# Patient Record
Sex: Male | Born: 1950 | Race: White | Hispanic: No | Marital: Single | Smoking: Former smoker
Health system: Southern US, Community
[De-identification: ages and names within clinical notes are randomized; demographics above are authoritative.]

## PROBLEM LIST (undated history)

## (undated) DIAGNOSIS — M1990 Unspecified osteoarthritis, unspecified site: Secondary | ICD-10-CM

## (undated) DIAGNOSIS — R002 Palpitations: Secondary | ICD-10-CM

## (undated) DIAGNOSIS — I639 Cerebral infarction, unspecified: Secondary | ICD-10-CM

## (undated) DIAGNOSIS — C801 Malignant (primary) neoplasm, unspecified: Secondary | ICD-10-CM

## (undated) DIAGNOSIS — S42201A Unspecified fracture of upper end of right humerus, initial encounter for closed fracture: Secondary | ICD-10-CM

## (undated) DIAGNOSIS — F32A Depression, unspecified: Secondary | ICD-10-CM

## (undated) DIAGNOSIS — H33311 Horseshoe tear of retina without detachment, right eye: Secondary | ICD-10-CM

## (undated) DIAGNOSIS — I252 Old myocardial infarction: Secondary | ICD-10-CM

## (undated) DIAGNOSIS — K219 Gastro-esophageal reflux disease without esophagitis: Secondary | ICD-10-CM

## (undated) DIAGNOSIS — I4891 Unspecified atrial fibrillation: Secondary | ICD-10-CM

## (undated) DIAGNOSIS — G4730 Sleep apnea, unspecified: Secondary | ICD-10-CM

## (undated) DIAGNOSIS — F329 Major depressive disorder, single episode, unspecified: Secondary | ICD-10-CM

## (undated) DIAGNOSIS — I10 Essential (primary) hypertension: Secondary | ICD-10-CM

## (undated) HISTORY — DX: Depression, unspecified: F32.A

## (undated) HISTORY — PX: TONSILLECTOMY: SUR1361

## (undated) HISTORY — PX: CARPAL TUNNEL RELEASE: SHX101

## (undated) HISTORY — PX: CLAVICLE SURGERY: SHX598

## (undated) HISTORY — DX: Palpitations: R00.2

## (undated) HISTORY — DX: Old myocardial infarction: I25.2

## (undated) HISTORY — DX: Major depressive disorder, single episode, unspecified: F32.9

## (undated) HISTORY — DX: Cerebral infarction, unspecified: I63.9

## (undated) HISTORY — PX: COLONOSCOPY: SHX174

## (undated) HISTORY — PX: STOMACH SURGERY: SHX791

---

## 2003-02-10 DIAGNOSIS — I639 Cerebral infarction, unspecified: Secondary | ICD-10-CM

## 2003-02-10 HISTORY — DX: Cerebral infarction, unspecified: I63.9

## 2008-02-10 DIAGNOSIS — I252 Old myocardial infarction: Secondary | ICD-10-CM

## 2008-02-10 HISTORY — DX: Old myocardial infarction: I25.2

## 2008-02-10 HISTORY — PX: CARDIAC CATHETERIZATION: SHX172

## 2009-10-03 ENCOUNTER — Ambulatory Visit: Payer: Self-pay | Admitting: Diagnostic Radiology

## 2009-10-03 ENCOUNTER — Ambulatory Visit (HOSPITAL_BASED_OUTPATIENT_CLINIC_OR_DEPARTMENT_OTHER): Admission: RE | Admit: 2009-10-03 | Discharge: 2009-10-03 | Payer: Self-pay | Admitting: Internal Medicine

## 2010-02-14 ENCOUNTER — Encounter
Admission: RE | Admit: 2010-02-14 | Discharge: 2010-02-14 | Payer: Self-pay | Source: Home / Self Care | Attending: Internal Medicine | Admitting: Internal Medicine

## 2010-03-19 ENCOUNTER — Other Ambulatory Visit: Payer: Self-pay | Admitting: Psychiatry

## 2010-03-19 DIAGNOSIS — M542 Cervicalgia: Secondary | ICD-10-CM

## 2010-03-23 ENCOUNTER — Ambulatory Visit
Admission: RE | Admit: 2010-03-23 | Discharge: 2010-03-23 | Disposition: A | Discharge status: Home / Self Care | Payer: Medicare PPO | Source: Ambulatory Visit | Attending: Psychiatry | Admitting: Psychiatry

## 2010-03-23 DIAGNOSIS — M542 Cervicalgia: Secondary | ICD-10-CM

## 2010-03-23 MED ORDER — GADOBENATE DIMEGLUMINE 529 MG/ML IV SOLN
20.0000 mL | Freq: Once | INTRAVENOUS | Status: AC | PRN
  Administered 2010-03-23: 20 mL via INTRAVENOUS

## 2011-04-25 ENCOUNTER — Ambulatory Visit (INDEPENDENT_AMBULATORY_CARE_PROVIDER_SITE_OTHER): Payer: Medicare PPO | Admitting: Family Medicine

## 2011-04-25 VITALS — BP 107/70 | HR 114 | Temp 97.9°F | Resp 18 | Ht 68.0 in | Wt 243.0 lb

## 2011-04-25 DIAGNOSIS — T148XXA Other injury of unspecified body region, initial encounter: Secondary | ICD-10-CM

## 2011-04-25 DIAGNOSIS — W11XXXA Fall on and from ladder, initial encounter: Secondary | ICD-10-CM

## 2011-04-25 MED ORDER — DOXYCYCLINE HYCLATE 100 MG PO TABS: 100.0000 mg | ORAL_TABLET | Freq: Two times a day (BID) | ORAL | Status: AC

## 2011-04-25 NOTE — Progress Notes (Signed)
  Subjective:    Patient ID: Wesley Beard, male    DOB: [birth year], 61 y.o.   MRN: [id number]  HPI Patient presents after falling off ladder  6 days ago. Patient fell on left side.  Patient's (R) leg suffered a laceration as he was falling. More redness and swelling over the last several days in area around anterior tibia.  Also bruise (L) elbow  LBP and left hip pain   Review of Systems     Objective:   Physical Exam  Constitutional: He appears well-developed and well-nourished.  Neck: Neck supple.  Cardiovascular: Normal rate, regular rhythm and normal heart sounds.   Pulmonary/Chest: Effort normal and breath sounds normal.  Abdominal: Soft. Bowel sounds are normal.  Musculoskeletal: Normal range of motion.  Neurological: He is alert.  Skin:     Psychiatric: He has a normal mood and affect.        Assessment & Plan:   1. Fall from ladder    2. Hematoma, infected (R) LE doxycycline (VIBRA-TABS) 100 MG tablet   Anticipatory guidance Patient to follow up in 48 hours, sooner if symptoms worsen

## 2011-04-28 ENCOUNTER — Ambulatory Visit: Payer: Medicare PPO

## 2011-04-28 ENCOUNTER — Ambulatory Visit (INDEPENDENT_AMBULATORY_CARE_PROVIDER_SITE_OTHER): Payer: Medicare PPO | Admitting: Family Medicine

## 2011-04-28 VITALS — BP 116/79 | HR 82 | Temp 98.4°F | Resp 16 | Ht 68.0 in | Wt 244.0 lb

## 2011-04-28 DIAGNOSIS — M7989 Other specified soft tissue disorders: Secondary | ICD-10-CM

## 2011-04-28 DIAGNOSIS — M79609 Pain in unspecified limb: Secondary | ICD-10-CM

## 2011-04-28 DIAGNOSIS — S8990XA Unspecified injury of unspecified lower leg, initial encounter: Secondary | ICD-10-CM

## 2011-04-28 DIAGNOSIS — S8991XA Unspecified injury of right lower leg, initial encounter: Secondary | ICD-10-CM

## 2011-04-28 DIAGNOSIS — M79604 Pain in right leg: Secondary | ICD-10-CM

## 2011-04-28 NOTE — Progress Notes (Signed)
[facility]:  Office Visit  Chief Complaint:  Chief Complaint  Patient presents with  . Leg Pain    not better from 3/16  . Leg Swelling    HPI: Wesley Beard is a 61 y.o. male who complains of  Right leg pain s/p falling off ladder about 9 days ago. Was evaluated and dx with hematoma and laceration. He cont to have pain and swelling. No xrays taken. H/o TIA. Denies SOB  Past Medical History  Diagnosis Date  . MI, old 2010  . Heart palpitations     Dr. Judithe Modest  . Stroke   . Depression    Past Surgical History  Procedure Date  . Cardiac catheterization     normal  . Clavicle surgery    History   Social History  . Marital Status: Single    Spouse Name: N/A    Number of Children: N/A  . Years of Education: N/A   Social History Main Topics  . Smoking status: Former Smoker    Quit date: 09/29/2006  . Smokeless tobacco: None  . Alcohol Use: None  . Drug Use: None  . Sexually Active: None   Other Topics Concern  . None   Social History Narrative  . None   Family History  Problem Relation Age of Onset  . Heart disease Mother   . Heart disease Father    No Known Allergies Prior to Admission medications   Medication Sig Start Date End Date Taking? Authorizing Provider  ARIPiprazole (ABILIFY) 5 MG tablet Take 5 mg by mouth daily.    Historical Provider, MD  atorvastatin (LIPITOR) 80 MG tablet Take 80 mg by mouth daily.    Historical Provider, MD  Cinnamon 500 MG TABS Take 500 mg by mouth 2 (two) times daily.    Historical Provider, MD  doxycycline (VIBRA-TABS) 100 MG tablet Take 1 tablet (100 mg total) by mouth 2 (two) times daily. 04/25/11 05/05/11  Dois Davenport, MD  losartan (COZAAR) 50 MG tablet Take 50 mg by mouth daily.    Historical Provider, MD  mometasone (NASONEX) 50 MCG/ACT nasal spray Place 2 sprays into the nose daily.    Historical Provider, MD     ROS: The patient denies fevers, chills, night sweats, unintentional weight  loss, chest pain, palpitations, wheezing, dyspnea on exertion, nausea, vomiting, abdominal pain, dysuria, hematuria, melena, numbness, weakness, or tingling. + leg pain, swelling  All other systems have been reviewed and were otherwise negative with the exception of those mentioned in the HPI and as above.    PHYSICAL EXAM: Filed Vitals:   04/28/11 1048  BP: 116/79  Pulse: 82  Temp: 98.4 F (36.9 C)  Resp: 16   Filed Vitals:   04/28/11 1048  Height: 5\' 8"  (1.727 m)  Weight: 244 lb (110.678 kg)   Body mass index is 37.10 kg/(m^2).  General: Alert, no acute distress HEENT:  Normocephalic, atraumatic, oropharynx patent.  Cardiovascular:  Regular rate and rhythm, no rubs murmurs or gallops.  No Carotid bruits, radial pulse intact. No pedal edema.  Respiratory: Clear to auscultation bilaterally.  No wheezes, rales, or rhonchi.  No cyanosis, no use of accessory musculature GI: No organomegaly, abdomen is soft and non-tender, positive bowel sounds.  No masses. Skin: + laceration and hematoma on Right leg. Swelling on right leg > left. + erythema. + scab Neurologic: Facial musculature symmetric. Psychiatric: Patient is appropriate throughout our interaction. Lymphatic: No cervical lymphadenopathy Musculoskeletal: Gait intact. Left  calf 11.5 inches Right calf 14 inches   LABS: No results found for this or any previous visit.   EKG/XRAY:   Primary read interpreted by Dr. Conley Rolls at [facility]. No dislocation. No fx at site of hematoma however there is ? Old boney vs acute  abnormality above tibial tuberosity.    ASSESSMENT/PLAN: Encounter Diagnoses  Name Primary?  . Right leg injury Yes  . Right leg pain   . Leg swelling    C/w RICE C/w Doxycycline Xray negative for fx/dislocation Will get Doppler study r/o DVT, patient has hx of several TIAs    Rahil Passey PHUONG, DO 04/28/2011 11:57 AM

## 2011-05-01 ENCOUNTER — Telehealth: Payer: Self-pay

## 2011-05-01 ENCOUNTER — Ambulatory Visit (HOSPITAL_COMMUNITY)
Admission: RE | Admit: 2011-05-01 | Discharge: 2011-05-01 | Disposition: A | Discharge status: Home / Self Care | Payer: Medicare PPO | Source: Ambulatory Visit | Attending: Family Medicine | Admitting: Family Medicine

## 2011-05-01 DIAGNOSIS — M7989 Other specified soft tissue disorders: Secondary | ICD-10-CM | POA: Insufficient documentation

## 2011-05-01 DIAGNOSIS — M79604 Pain in right leg: Secondary | ICD-10-CM

## 2011-05-01 NOTE — Telephone Encounter (Signed)
 calling to get in contact with Dr Conley Rolls or nurse -pt was seen in office today and his lab report came back negative he does have a hematoma if any questions please contact [phone]

## 2011-05-01 NOTE — Telephone Encounter (Signed)
Left message regarding negative DVT, + hematoma on doppler. C/w RICE

## 2011-05-01 NOTE — Progress Notes (Signed)
*  PRELIMINARY RESULTS* Vascular Ultrasound Right lower extremity venous duplex has been completed.  Preliminary findings: Right= No evidence of deep or superficial thrombus. Hematoma seen in anterior mid calf at area of injury.  Farrel Demark RDMS 05/01/2011, 10:57 AM

## 2012-02-14 ENCOUNTER — Ambulatory Visit (INDEPENDENT_AMBULATORY_CARE_PROVIDER_SITE_OTHER): Payer: Medicare PPO | Admitting: Family Medicine

## 2012-02-14 ENCOUNTER — Ambulatory Visit: Payer: Medicare PPO

## 2012-02-14 VITALS — BP 111/74 | HR 76 | Temp 98.3°F | Resp 16 | Ht 68.0 in | Wt 224.0 lb

## 2012-02-14 DIAGNOSIS — R05 Cough: Secondary | ICD-10-CM

## 2012-02-14 DIAGNOSIS — R51 Headache: Secondary | ICD-10-CM

## 2012-02-14 DIAGNOSIS — R059 Cough, unspecified: Secondary | ICD-10-CM

## 2012-02-14 DIAGNOSIS — IMO0001 Reserved for inherently not codable concepts without codable children: Secondary | ICD-10-CM

## 2012-02-14 DIAGNOSIS — J111 Influenza due to unidentified influenza virus with other respiratory manifestations: Secondary | ICD-10-CM

## 2012-02-14 DIAGNOSIS — M791 Myalgia, unspecified site: Secondary | ICD-10-CM

## 2012-02-14 LAB — POCT CBC
HCT, POC: 41.9 % — AB (ref 43.5–53.7)
Hemoglobin: 13 g/dL — AB (ref 14.1–18.1)
MCH, POC: 30.2 pg (ref 27–31.2)
MCHC: 31 g/dL — AB (ref 31.8–35.4)
MID (cbc): 1.1 — AB (ref 0–0.9)
RBC: 4.31 M/uL — AB (ref 4.69–6.13)

## 2012-02-14 LAB — POCT INFLUENZA A/B: Influenza B, POC: NEGATIVE

## 2012-02-14 MED ORDER — BENZONATATE 100 MG PO CAPS: ORAL_CAPSULE | ORAL | Status: DC

## 2012-02-14 MED ORDER — AZITHROMYCIN 250 MG PO TABS: ORAL_TABLET | ORAL | Status: DC

## 2012-02-14 NOTE — Progress Notes (Signed)
Subjective: Patient has had a cough and congestion for about 5 days. He had a low-grade fever at home. He lives alone, but his son brought him in today. He does not smoke. He is retired. He had a heart attack couple of years ago. He has had mild upper [place] congestion and occasional sneezing. His throat is been little sore. The cough has only been moderately productive with some yellow gray phlegm. He just keeps feeling weaker.  Objective: Overweight male who looks somewhat ill. He is breathing comfortably. His TMs are normal. Throat mildly erythematous. Neck supple without significant nodes. Chest is clear to auscultation except for a occasional rhonchus in the left lower lobe. Heart was regular without murmurs.  Assessment: Cough Dyspnea Fatigued  Plan: Chest x-ray and flu tests and CBC  Results for orders placed in visit on 02/14/12  POCT CBC      Component Value Range   WBC 10.3 (*) 4.6 - 10.2 K/uL   Lymph, poc 3.9 (*) 0.6 - 3.4   POC LYMPH PERCENT 38.2  10 - 50 %L   MID (cbc) 1.1 (*) 0 - 0.9   POC MID % 10.2  0 - 12 %M   POC Granulocyte 5.3  2 - 6.9   Granulocyte percent 51.6  37 - 80 %G   RBC 4.31 (*) 4.69 - 6.13 M/uL   Hemoglobin 13.0 (*) 14.1 - 18.1 g/dL   HCT, POC 16.1 (*) 09.6 - 53.7 %   MCV 97.2 (*) 80 - 97 fL   MCH, POC 30.2  27 - 31.2 pg   MCHC 31.0 (*) 31.8 - 35.4 g/dL   RDW, POC 04.5     Platelet Count, POC 314  142 - 424 K/uL   MPV 8.8  0 - 99.8 fL  POCT INFLUENZA A/B      Component Value Range   Influenza A, POC Negative     Influenza B, POC Negative     [facility] reading (PRIMARY) by  Dr. Alwyn Ren Lungs appear normal. He is a fairly prominent stomach air bubble.  Bronchitis status post viral illness.  Plan: With a slightly elevated white count and progressive malaise I'm going to go ahead and treat him with a course of antibiotics for a post viral bronchitis. Marland Kitchen

## 2012-02-14 NOTE — Patient Instructions (Signed)
Drink plenty of fluids.  Take medications as directed  Return if getting worse or just not improving over the next week.

## 2012-05-09 ENCOUNTER — Telehealth: Payer: Self-pay | Admitting: Oncology

## 2012-05-09 NOTE — Telephone Encounter (Signed)
S/W PT IN RE NP APPT 04/29 @ 10:30 W/DR. SHADAD REFERRING DR. Greggory Stallion [facility] DX- ANEMIA WELCOME PACKET MAILED.

## 2012-05-09 NOTE — Telephone Encounter (Signed)
C/D 05/09/12 for appt. 06/07/12

## 2012-05-11 ENCOUNTER — Other Ambulatory Visit: Payer: Self-pay | Admitting: Internal Medicine

## 2012-05-11 DIAGNOSIS — R05 Cough: Secondary | ICD-10-CM

## 2012-05-16 ENCOUNTER — Other Ambulatory Visit: Payer: Medicare PPO

## 2012-05-16 ENCOUNTER — Ambulatory Visit
Admission: RE | Admit: 2012-05-16 | Discharge: 2012-05-16 | Disposition: A | Discharge status: Home / Self Care | Payer: Medicare PPO | Source: Ambulatory Visit | Attending: Internal Medicine | Admitting: Internal Medicine

## 2012-05-16 DIAGNOSIS — R05 Cough: Secondary | ICD-10-CM

## 2012-06-07 ENCOUNTER — Ambulatory Visit: Payer: Medicare PPO | Admitting: Oncology

## 2012-06-07 ENCOUNTER — Ambulatory Visit: Payer: Medicare PPO

## 2012-06-07 ENCOUNTER — Other Ambulatory Visit: Payer: Medicare PPO | Admitting: Lab

## 2012-06-20 ENCOUNTER — Other Ambulatory Visit: Payer: Self-pay | Admitting: Oncology

## 2012-06-20 DIAGNOSIS — D649 Anemia, unspecified: Secondary | ICD-10-CM

## 2012-06-24 ENCOUNTER — Ambulatory Visit: Payer: Medicare PPO

## 2012-06-24 ENCOUNTER — Encounter: Payer: Self-pay | Admitting: Oncology

## 2012-06-24 ENCOUNTER — Ambulatory Visit (HOSPITAL_BASED_OUTPATIENT_CLINIC_OR_DEPARTMENT_OTHER): Payer: Medicare PPO | Admitting: Oncology

## 2012-06-24 ENCOUNTER — Other Ambulatory Visit (HOSPITAL_BASED_OUTPATIENT_CLINIC_OR_DEPARTMENT_OTHER): Payer: Medicare PPO | Admitting: Lab

## 2012-06-24 VITALS — BP 111/60 | HR 60 | Temp 97.9°F | Resp 18 | Ht 68.0 in | Wt 215.0 lb

## 2012-06-24 DIAGNOSIS — D649 Anemia, unspecified: Secondary | ICD-10-CM

## 2012-06-24 DIAGNOSIS — D539 Nutritional anemia, unspecified: Secondary | ICD-10-CM

## 2012-06-24 LAB — CBC WITH DIFFERENTIAL/PLATELET
EOS%: 3.1 % (ref 0.0–7.0)
Eosinophils Absolute: 0.2 10*3/uL (ref 0.0–0.5)
HCT: 38.9 % (ref 38.4–49.9)
HGB: 13 g/dL (ref 13.0–17.1)
MCHC: 33.4 g/dL (ref 32.0–36.0)
MCV: 93.7 fL (ref 79.3–98.0)
MONO#: 0.6 10*3/uL (ref 0.1–0.9)
MONO%: 8.9 % (ref 0.0–14.0)
RDW: 14.1 % (ref 11.0–14.6)

## 2012-06-24 LAB — COMPREHENSIVE METABOLIC PANEL (CC13)
AST: 19 U/L (ref 5–34)
Albumin: 3.9 g/dL (ref 3.5–5.0)
BUN: 21.5 mg/dL (ref 7.0–26.0)
CO2: 27 mEq/L (ref 22–29)
Chloride: 106 mEq/L (ref 98–107)
Potassium: 4.4 mEq/L (ref 3.5–5.1)
Sodium: 141 mEq/L (ref 136–145)
Total Protein: 6.9 g/dL (ref 6.4–8.3)

## 2012-06-24 LAB — CHCC SMEAR

## 2012-06-24 NOTE — Progress Notes (Signed)
Reason for Referral: Anemia.   HPI: [name] is a 62 year old gentleman native of [place] currently and to [place] area for the last 5 years. He is a gentleman with past medical history significant for depression and coronary artery disease among other health issues follows up with his primary care physician for these issues. And most recent months [name] health have been fluctuating with a few symptoms including peripheral neuropathy, dizziness, weakness and weight loss. He had a CBC done in February of 2014 and should a hemoglobin of 11.9 with normal indices. He had normal white cell count and platelets and normal differential. His repeat CBC in March of 2014 was normal hemoglobin over 13. He was also noted to have a mild renal insufficiency and was referred to by his primary care physician for an evaluation for his anemia. Since his last evaluation he underwent a colonoscopy and found to have multiple polyps that have been removed. He had not reported any bleeding he had not reported any epistaxis or hematochezia has not reported any melena or genitourinary bleeding.  He is a symptomatic at times with occasional weakness and fatigue he does have some neuropathy and dizziness. But still lives independently and attends the activity of daily living without any hindrance or decline.   He does report a dry cough that have been worked up in the past including a CT scan of the chest that I reviewed today also with [name] and was completely unremarkable.   Past Medical History  Diagnosis Date  . MI, old 2010  . Heart palpitations     Dr. Judithe Wesley Beard  . Stroke   . Depression   :  Past Surgical History  Procedure Laterality Date  . Cardiac catheterization      normal  . Clavicle surgery    :  Current Outpatient Prescriptions  Medication Sig Dispense Refill  . triamcinolone cream (KENALOG) 0.1 % Apply 1 application topically 2 (two) times daily.      . ARIPiprazole (ABILIFY) 5  MG tablet Take 5 mg by mouth daily.      Marland Kitchen aspirin 81 MG tablet Take 81 mg by mouth daily.      Marland Kitchen atorvastatin (LIPITOR) 80 MG tablet Take 80 mg by mouth daily.      Marland Kitchen azithromycin (ZITHROMAX) 250 MG tablet Take 2 initiallly, then one daily for 4 days  6 tablet  0  . b complex vitamins tablet Take 1 tablet by mouth daily.      . benzonatate (TESSALON) 100 MG capsule Use 1-2 tablets 3 times daily as necessary for cough. May be used with other cough medicines if needed.  30 capsule  0  . buPROPion (WELLBUTRIN XL) 300 MG 24 hr tablet Take 300 mg by mouth daily.      . Cinnamon 500 MG TABS Take 500 mg by mouth 2 (two) times daily.      Marland Kitchen losartan (COZAAR) 50 MG tablet Take 50 mg by mouth daily.      . metoprolol succinate (TOPROL-XL) 25 MG 24 hr tablet Take 25 mg by mouth 2 (two) times daily.      . mometasone (NASONEX) 50 MCG/ACT nasal spray Place 2 sprays into the nose daily.      . traZODone (DESYREL) 50 MG tablet Take 50 mg by mouth at bedtime.      Marland Kitchen UNABLE TO FIND Allergy Shots (weekly)       No current facility-administered medications for this visit.  No Known Allergies:  Family History  Problem Relation Age of Onset  . Heart disease Mother   . Heart disease Father   :  History   Social History  . Marital Status: Single    Spouse Name: N/A    Number of Children: N/A  . Years of Education: N/A   Occupational History  . Not on file.   Social History Main Topics  . Smoking status: Former Smoker    Quit date: 09/29/2006  . Smokeless tobacco: Not on file  . Alcohol Use: Yes  . Drug Use: No  . Sexually Active: No   Other Topics Concern  . Not on file   Social History Narrative  . No narrative on file  :  A comprehensive review of systems was negative.  ECOG 0  Exam: Blood pressure 111/60, pulse 60, temperature 97.9 F (36.6 C), temperature source Oral, resp. rate 18, height 5\' 8"  (1.727 m), weight 215 lb (97.523 kg). General appearance: alert Head:  Normocephalic, without obvious abnormality, atraumatic Throat: lips, mucosa, and tongue normal; teeth and gums normal Neck: no adenopathy, no carotid bruit, no JVD, supple, symmetrical, trachea midline and thyroid not enlarged, symmetric, no tenderness/mass/nodules Resp: clear to auscultation bilaterally Cardio: regular rate and rhythm, S1, S2 normal, no murmur, click, rub or gallop GI: soft, non-tender; bowel sounds normal; no masses,  no organomegaly Extremities: extremities normal, atraumatic, no cyanosis or edema Lymph nodes: Cervical, supraclavicular, and axillary nodes normal.   Recent Labs  06/24/12 1028  WBC 7.0  HGB 13.0  HCT 38.9  PLT 186    Recent Labs  06/24/12 1028  NA 141  K 4.4  CL 106  CO2 27  GLUCOSE 105*  BUN 21.5  CREATININE 1.2  CALCIUM 9.0     Blood smear review:Normal    Assessment and Plan:   62 year old gentleman with normocytic normochromic anemia that was mild hemoglobin 11.9. His anemia was transient and currently his hemoglobin is 13.0. The differential diagnosis of his transient mild anemia was discussed today this includes conditions like mild renal insufficiency, weight loss due to colon polyps, and medications. Conditions such as myelodysplastic syndrome, plasma cell disorders, leukemias and lymphomas are extremely unlikely. B12 and folic acid as well as iron panel have been checked and all within normal range. His kidney function is normal today with a creatinine of 1.2 which could explain his normalization of his hemoglobin at this time. I see no need for further hematological workup I do not think he needs a bone marrow biopsy or any imaging studies at this time. My recommendation at this time is to continue followup with routine CBCs to be done at his primary care physician and I'll be happy to evaluate him in the future as needed. I will not schedule him routinely but he knows to contact me at any issues arise.  As for his symptoms of  fatigue, dizziness, and depression I do not think they are related to his mild transient anemia that have resolved.  All his questions are answered today in a be happy to see [name] in the future as needed.

## 2012-06-24 NOTE — Progress Notes (Signed)
Checked in new patient. No financial issues. °

## 2012-07-07 ENCOUNTER — Encounter: Payer: Self-pay | Admitting: *Deleted

## 2012-07-07 NOTE — Progress Notes (Signed)
Mailed updated med list to patient's home.

## 2012-12-04 ENCOUNTER — Emergency Department (HOSPITAL_COMMUNITY): Payer: Medicare PPO

## 2012-12-04 ENCOUNTER — Emergency Department (HOSPITAL_COMMUNITY)
Admission: EM | Admit: 2012-12-04 | Discharge: 2012-12-04 | Disposition: A | Discharge status: Home / Self Care | Payer: Medicare PPO | Attending: Emergency Medicine | Admitting: Emergency Medicine

## 2012-12-04 ENCOUNTER — Encounter (HOSPITAL_COMMUNITY): Payer: Self-pay | Admitting: Emergency Medicine

## 2012-12-04 DIAGNOSIS — Z79899 Other long term (current) drug therapy: Secondary | ICD-10-CM | POA: Insufficient documentation

## 2012-12-04 DIAGNOSIS — IMO0002 Reserved for concepts with insufficient information to code with codable children: Secondary | ICD-10-CM | POA: Insufficient documentation

## 2012-12-04 DIAGNOSIS — F329 Major depressive disorder, single episode, unspecified: Secondary | ICD-10-CM | POA: Insufficient documentation

## 2012-12-04 DIAGNOSIS — Z8673 Personal history of transient ischemic attack (TIA), and cerebral infarction without residual deficits: Secondary | ICD-10-CM | POA: Insufficient documentation

## 2012-12-04 DIAGNOSIS — R059 Cough, unspecified: Secondary | ICD-10-CM | POA: Insufficient documentation

## 2012-12-04 DIAGNOSIS — I252 Old myocardial infarction: Secondary | ICD-10-CM | POA: Insufficient documentation

## 2012-12-04 DIAGNOSIS — F3289 Other specified depressive episodes: Secondary | ICD-10-CM | POA: Insufficient documentation

## 2012-12-04 DIAGNOSIS — R42 Dizziness and giddiness: Secondary | ICD-10-CM | POA: Insufficient documentation

## 2012-12-04 DIAGNOSIS — R0602 Shortness of breath: Secondary | ICD-10-CM | POA: Insufficient documentation

## 2012-12-04 DIAGNOSIS — Z87891 Personal history of nicotine dependence: Secondary | ICD-10-CM | POA: Insufficient documentation

## 2012-12-04 DIAGNOSIS — Z9861 Coronary angioplasty status: Secondary | ICD-10-CM | POA: Insufficient documentation

## 2012-12-04 DIAGNOSIS — Z7982 Long term (current) use of aspirin: Secondary | ICD-10-CM | POA: Insufficient documentation

## 2012-12-04 DIAGNOSIS — R05 Cough: Secondary | ICD-10-CM | POA: Insufficient documentation

## 2012-12-04 LAB — CBC
Hemoglobin: 11.9 g/dL — ABNORMAL LOW (ref 13.0–17.0)
MCH: 32.2 pg (ref 26.0–34.0)
MCV: 97 fL (ref 78.0–100.0)
RBC: 3.69 MIL/uL — ABNORMAL LOW (ref 4.22–5.81)

## 2012-12-04 LAB — BASIC METABOLIC PANEL
CO2: 21 mEq/L (ref 19–32)
Creatinine, Ser: 1.23 mg/dL (ref 0.50–1.35)
GFR calc non Af Amer: 60 mL/min — ABNORMAL LOW (ref >90)
Glucose, Bld: 85 mg/dL (ref 70–99)
Potassium: 3.8 mEq/L (ref 3.5–5.1)
Sodium: 137 mEq/L (ref 135–145)

## 2012-12-04 NOTE — ED Notes (Signed)
Pt reports having cough and shortness of breath x 6 months. He reports it's gotten incresingly worse in the last week or so. Pt is unable to hold a conversation due to cough.

## 2012-12-04 NOTE — ED Notes (Signed)
I discharge him for a colleague.  He is very frustrated in his demeanor, but verbalizes understanding that his tests today were normal.  He ambulates without difficulty.  He refuses v.s. And to sign d/c.

## 2012-12-04 NOTE — ED Provider Notes (Signed)
CSN: [id number]     Arrival date & time 12/04/12  1424 History   First MD Initiated Contact with Patient 12/04/12 1449     Chief Complaint  Patient presents with  . Cough  . Shortness of Breath  . Dizziness    HPI  Patient presents with a cough of over 14 months duration. His dizziness primary care physician on several occasions. He specifically by pulmonary. Has been referred to ENT. His pressure denies cough. He does has intensity details the specific dates of his interactions with his physicians. Find evidence of his code chart have ENT or pulmonary. He is uncertain of the [facility] function testing. He was given albuterol and Advair by the pulmonologist. He is referred to ENT. He states he "thinks so" when asked if he had nasal endoscopy or evaluation by ENT. No nasal polyps. No sinus sinusitis. He has no sinusitis symptoms.Marland Kitchen  diffuse medications. Is not on ACE inhibitor other medicines a torus for causing a cough. A current nonsmoker. Stopped in 08 after 20-25 year smoking history. He states he "doesn't know" he was told with spirometry may have COPD. He describes the cough as  occasionally productive of clear sputum. He states that he does sleep at night, it after taking amitriptyline and trazodone.  No other symptoms. No weight loss. No P. type symptoms. No night sweats. He did have CT of his chest April of this year that showed no adenopathy. No other concerning findings. He does not recall his ever had TB testing. He has not had travel to endemic areas for pulmonary infections. He has never been incarcerated.  Past Medical History  Diagnosis Date  . MI, old 2010  . Heart palpitations     Dr. Judithe Modest  . Stroke   . Depression    Past Surgical History  Procedure Laterality Date  . Cardiac catheterization      normal  . Clavicle surgery     Family History  Problem Relation Age of Onset  . Heart disease Mother   . Heart disease Father    History  Substance Use Topics  . Smoking  status: Former Smoker    Quit date: 09/29/2006  . Smokeless tobacco: Not on file  . Alcohol Use: Yes    Review of Systems  Constitutional: Negative for fever, chills, diaphoresis, appetite change and fatigue.  HENT: Negative for mouth sores, sore throat and trouble swallowing.   Eyes: Negative for visual disturbance.  Respiratory: Positive for cough and shortness of breath. Negative for chest tightness and wheezing.   Cardiovascular: Negative for chest pain.  Gastrointestinal: Negative for nausea, vomiting, abdominal pain, diarrhea and abdominal distention.  Endocrine: Negative for polydipsia, polyphagia and polyuria.  Genitourinary: Negative for dysuria, frequency and hematuria.  Musculoskeletal: Negative for gait problem.  Skin: Negative for color change, pallor and rash.  Neurological: Negative for dizziness, syncope, light-headedness and headaches.  Hematological: Does not bruise/bleed easily.  Psychiatric/Behavioral: Negative for behavioral problems and confusion.    Allergies  Review of patient's allergies indicates no known allergies.  Home Medications   Current Outpatient Rx  Name  Route  Sig  Dispense  Refill  . albuterol (PROVENTIL HFA;VENTOLIN HFA) 108 (90 BASE) MCG/ACT inhaler   Inhalation   Inhale 2 puffs into the lungs every 6 (six) hours as needed for wheezing.         Marland Kitchen amitriptyline (ELAVIL) 10 MG tablet   Oral   Take 10 mg by mouth at bedtime.         Marland Kitchen  aspirin 81 MG tablet   Oral   Take 81 mg by mouth daily.         Marland Kitchen b complex vitamins tablet   Oral   Take 1 tablet by mouth daily.         . beclomethasone (QVAR) 80 MCG/ACT inhaler   Inhalation   Inhale 1 puff into the lungs as needed.         Marland Kitchen buPROPion (WELLBUTRIN XL) 300 MG 24 hr tablet   Oral   Take 300 mg by mouth daily.         . chlorpheniramine-phenylephrine-dextromethorphan (CARDEC DM) 3.5-1-3 MG/ML solution   Oral   Take 5 mLs by mouth every 6 (six) hours as needed for  cough (cough).         . Fluticasone-Salmeterol (ADVAIR) 250-50 MCG/DOSE AEPB   Inhalation   Inhale 1 puff into the lungs every 12 (twelve) hours.         . gabapentin (NEURONTIN) 100 MG capsule   Oral   Take 100 mg by mouth 2 (two) times daily.         Marland Kitchen losartan (COZAAR) 50 MG tablet   Oral   Take 50 mg by mouth daily.         . metoprolol succinate (TOPROL-XL) 25 MG 24 hr tablet   Oral   Take 25 mg by mouth 2 (two) times daily.         . traZODone (DESYREL) 50 MG tablet   Oral   Take 50 mg by mouth 2 (two) times daily.           BP 155/77  Pulse 76  Temp(Src) 98.3 F (36.8 C) (Oral)  Resp 25  SpO2 99% Physical Exam  Constitutional: He is oriented to person, place, and time. He appears well-developed and well-nourished. No distress.  HENT:  Head: Normocephalic.  Eyes: Conjunctivae are normal. Pupils are equal, round, and reactive to light. No scleral icterus.  Neck: Normal range of motion. Neck supple. No thyromegaly present.  Cardiovascular: Normal rate and regular rhythm.  Exam reveals no gallop and no friction rub.   No murmur heard. Pulmonary/Chest: Effort normal and breath sounds normal. No respiratory distress. He has no wheezes. He has no rales.  He does have a frequent cough during the exam. Is nonproductive. His lungs are clear. No prolongation. No adenopathy in his neck or chest or axilla.  Abdominal: Soft. Bowel sounds are normal. He exhibits no distension. There is no tenderness. There is no rebound.  Musculoskeletal: Normal range of motion.  Neurological: He is alert and oriented to person, place, and time.  Skin: Skin is warm and dry. No rash noted.  No dependent edema. According to the extremities.  Psychiatric: He has a normal mood and affect. His behavior is normal.    ED Course  Procedures (including critical care time) Labs Review Labs Reviewed  CBC - Abnormal; Notable for the following:    RBC 3.69 (*)    Hemoglobin 11.9 (*)     HCT 35.8 (*)    All other components within normal limits  BASIC METABOLIC PANEL  POCT I-STAT TROPONIN I   Imaging Review Dg Chest 2 View (if Patient Has Fever And/or Copd)  12/04/2012   CLINICAL DATA:  Cough.  EXAM: CHEST  2 VIEW  COMPARISON:  February 14, 2012.  FINDINGS: The heart size and mediastinal contours are within normal limits. Both lungs are clear. The visualized skeletal structures are unremarkable.  IMPRESSION: No active cardiopulmonary disease.   Electronically Signed   By: Roque Lias M.D.   On: 12/04/2012 15:05    EKG Interpretation   None       MDM   1. Chronic cough    His x-ray here shows no gross cellulitis. No hyperinflation. No mediastinal lymph nodes. I've had several discussions with him. I tried review his charts from his ENT and pulmonary physicians is unavailable to me he is saturating 95% on room air. Is not tachycardic. He is not febrile. A recheck is temperature the room. Think this is a chronic cough and may simply be due to COPD. I think pulmonary reevaluation as indicated. After a medical screening exam today I don't feel that inpatient care already this testing done an emergent basis. Referred back to his pulmonologist for further care and testing.    Roney Marion, MD 12/04/12 [phone]

## 2012-12-27 ENCOUNTER — Other Ambulatory Visit (HOSPITAL_COMMUNITY): Payer: Self-pay | Admitting: Internal Medicine

## 2012-12-27 DIAGNOSIS — M79604 Pain in right leg: Secondary | ICD-10-CM

## 2012-12-27 DIAGNOSIS — M7989 Other specified soft tissue disorders: Secondary | ICD-10-CM

## 2012-12-28 ENCOUNTER — Ambulatory Visit (HOSPITAL_COMMUNITY)
Admission: RE | Admit: 2012-12-28 | Discharge: 2012-12-28 | Disposition: A | Discharge status: Home / Self Care | Payer: Medicare PPO | Source: Ambulatory Visit | Attending: Internal Medicine | Admitting: Internal Medicine

## 2012-12-28 DIAGNOSIS — M79609 Pain in unspecified limb: Secondary | ICD-10-CM | POA: Insufficient documentation

## 2012-12-28 DIAGNOSIS — M7989 Other specified soft tissue disorders: Secondary | ICD-10-CM

## 2012-12-28 DIAGNOSIS — M79604 Pain in right leg: Secondary | ICD-10-CM

## 2012-12-28 NOTE — Progress Notes (Signed)
*  PRELIMINARY RESULTS* Vascular Ultrasound Lower extremity venous duplex has been completed.  Preliminary findings: no evidence of DVT  Called report to the RN for Dr. Julio Sicks.     Farrel Demark, RDMS, RVT  12/28/2012, 10:58 AM

## 2014-02-17 ENCOUNTER — Encounter (HOSPITAL_COMMUNITY): Payer: Self-pay | Admitting: Emergency Medicine

## 2014-02-17 ENCOUNTER — Emergency Department (HOSPITAL_COMMUNITY)
Admission: EM | Admit: 2014-02-17 | Discharge: 2014-02-18 | Disposition: A | Discharge status: Home / Self Care | Payer: Worker's Compensation | Attending: Emergency Medicine | Admitting: Emergency Medicine

## 2014-02-17 ENCOUNTER — Emergency Department (HOSPITAL_COMMUNITY): Payer: Worker's Compensation

## 2014-02-17 DIAGNOSIS — Z7982 Long term (current) use of aspirin: Secondary | ICD-10-CM | POA: Insufficient documentation

## 2014-02-17 DIAGNOSIS — I252 Old myocardial infarction: Secondary | ICD-10-CM | POA: Diagnosis not present

## 2014-02-17 DIAGNOSIS — Y9389 Activity, other specified: Secondary | ICD-10-CM | POA: Insufficient documentation

## 2014-02-17 DIAGNOSIS — Z8673 Personal history of transient ischemic attack (TIA), and cerebral infarction without residual deficits: Secondary | ICD-10-CM | POA: Insufficient documentation

## 2014-02-17 DIAGNOSIS — S42301A Unspecified fracture of shaft of humerus, right arm, initial encounter for closed fracture: Secondary | ICD-10-CM | POA: Insufficient documentation

## 2014-02-17 DIAGNOSIS — W108XXA Fall (on) (from) other stairs and steps, initial encounter: Secondary | ICD-10-CM | POA: Insufficient documentation

## 2014-02-17 DIAGNOSIS — Y998 Other external cause status: Secondary | ICD-10-CM | POA: Insufficient documentation

## 2014-02-17 DIAGNOSIS — F329 Major depressive disorder, single episode, unspecified: Secondary | ICD-10-CM | POA: Diagnosis not present

## 2014-02-17 DIAGNOSIS — Y9289 Other specified places as the place of occurrence of the external cause: Secondary | ICD-10-CM | POA: Insufficient documentation

## 2014-02-17 DIAGNOSIS — Z79899 Other long term (current) drug therapy: Secondary | ICD-10-CM | POA: Insufficient documentation

## 2014-02-17 DIAGNOSIS — S4991XA Unspecified injury of right shoulder and upper arm, initial encounter: Secondary | ICD-10-CM | POA: Diagnosis present

## 2014-02-17 DIAGNOSIS — Z87891 Personal history of nicotine dependence: Secondary | ICD-10-CM | POA: Diagnosis not present

## 2014-02-17 DIAGNOSIS — W19XXXA Unspecified fall, initial encounter: Secondary | ICD-10-CM

## 2014-02-17 LAB — BASIC METABOLIC PANEL
Anion gap: 11 (ref 5–15)
BUN: 15 mg/dL (ref 6–23)
CHLORIDE: 105 meq/L (ref 96–112)
CO2: 23 mmol/L (ref 19–32)
Calcium: 8.9 mg/dL (ref 8.4–10.5)
Creatinine, Ser: 1.14 mg/dL (ref 0.50–1.35)
GFR calc Af Amer: 76 mL/min — ABNORMAL LOW (ref >90)
GFR calc non Af Amer: 66 mL/min — ABNORMAL LOW (ref >90)
Glucose, Bld: 102 mg/dL — ABNORMAL HIGH (ref 70–99)
POTASSIUM: 3.9 mmol/L (ref 3.5–5.1)
SODIUM: 139 mmol/L (ref 135–145)

## 2014-02-17 LAB — PROTIME-INR
INR: 0.96 (ref 0.00–1.49)
Prothrombin Time: 12.9 seconds (ref 11.6–15.2)

## 2014-02-17 LAB — ETHANOL: Alcohol, Ethyl (B): <5 mg/dL (ref 0–9)

## 2014-02-17 MED ORDER — ONDANSETRON HCL 4 MG/2ML IJ SOLN
INTRAMUSCULAR | Status: DC
Start: 2014-02-17 — End: 2014-02-18
  Filled 2014-02-17: qty 2

## 2014-02-17 MED ORDER — ONDANSETRON HCL 4 MG/2ML IJ SOLN
4.0000 mg | Freq: Once | INTRAMUSCULAR | Status: AC
  Administered 2014-02-17: 4 mg via INTRAVENOUS

## 2014-02-17 MED ORDER — FENTANYL CITRATE 0.05 MG/ML IJ SOLN
INTRAMUSCULAR | Status: AC
  Administered 2014-02-17: 50 ug via INTRAVENOUS
  Filled 2014-02-17: qty 2

## 2014-02-17 MED ORDER — FENTANYL CITRATE 0.05 MG/ML IJ SOLN
100.0000 ug | Freq: Once | INTRAMUSCULAR | Status: AC
  Administered 2014-02-17: 100 ug via INTRAVENOUS

## 2014-02-17 MED ORDER — FENTANYL CITRATE 0.05 MG/ML IJ SOLN
50.0000 ug | INTRAMUSCULAR | Status: DC | PRN
  Administered 2014-02-17: 50 ug via INTRAVENOUS
  Filled 2014-02-17: qty 2

## 2014-02-17 NOTE — ED Notes (Signed)
Fell down 15 steps, no LOC, right arm deformity, VSS, 250 mcg Fentanyl pta,

## 2014-02-17 NOTE — ED Provider Notes (Addendum)
Patient seen upon arrival. Seen with Dr. Billy Fischer. [place]. Has deformity of his humerus. Struck his head but no loss consciousness and minimal other complaints.  On exam patient has a normal primary secondary survey with exception of deformity tenderness and soft tissue swelling midshaft upper right upper arm.  Good pulses and sensation distally. He can extend the wrist and digits he can have adduct the fingers he can flex the wrist and digits. He has sensation intact all 3 distributions in particular to the dorsum of the hand.  Chest x-ray AP supine shows suggestion of vascular congestion. He has clear lungs and is well oxygenated.  No pneumothorax, hemothorax,  Humerus films show displaced overriding comminuted proximal third humerus fracture. Humeral head is well located.  Pelvis normal.  CT of his head and neck are obtained and pending.  X-ray discussed with orthopedics.  Injury, exam, and XR findings described in detail. Plan will be splint and sling. If in better anatomical alignment, remaining neuro  intact, and adequate pain control patient will be able to follow-up on Monday in their office for reevaluation.  CTs with no  acute abnormalities. Humeral fracture noted. Chest x-ray shows suggestion of interstitial fluid. Lungs clear on exam and well oxygenated. I think this is result of large body habitus and AP supine film. He is sat upright. His symptoms are improving with pain medication IV Valium and upright position. He is in a sling. He will be placed into a long-arm  humeral splint. Upper arm in better anatomical alignment in upright positiion.  Re exam shows intact neuro-vascular exam.   Plan will be home, pain medication, call Dr. Marlou Sa on Monday for follow-up appointment.  Tanna Furry, MD 02/17/14 2035  Tanna Furry, MD 02/18/14 [id number]  Tanna Furry, MD 02/18/14 1638  Tanna Furry, MD 02/18/14 4536  Tanna Furry, MD 02/18/14  [phone]

## 2014-02-17 NOTE — ED Provider Notes (Signed)
CSN: [id number]     Arrival date & time 02/17/14  2243 History   First MD Initiated Contact with Patient 02/17/14 2248     Chief Complaint  Patient presents with  . Fall     (Consider location/radiation/quality/duration/timing/severity/associated sxs/prior Treatment) HPI Comments: Talking to paramedics at [place] and fell down 15 steps, hitting forehead and with right arm deformity and pain.  Denies other areas of pain.  Patient is a 64 y.o. male presenting with fall.  Fall This is a new problem. The current episode started today. The problem occurs constantly. The problem has been unchanged. Associated symptoms include arthralgias and headaches (mild). Pertinent negatives include no abdominal pain, chest pain, congestion, coughing, fever, nausea, numbness, rash, sore throat, visual change, vomiting or weakness. The symptoms are aggravated by bending. Treatments tried: fentanyl by EMS. The treatment provided mild relief.    Past Medical History  Diagnosis Date  . MI, old 2010  . Heart palpitations     Dr. Otho Perl  . Stroke   . Depression    Past Surgical History  Procedure Laterality Date  . Cardiac catheterization      normal  . Clavicle surgery     Family History  Problem Relation Age of Onset  . Heart disease Mother   . Heart disease Father    History  Substance Use Topics  . Smoking status: Former Smoker    Quit date: 09/29/2006  . Smokeless tobacco: Not on file  . Alcohol Use: Yes    Review of Systems  Constitutional: Negative for fever.  HENT: Negative for congestion and sore throat.   Eyes: Negative for visual disturbance.  Respiratory: Negative for cough and shortness of breath.   Cardiovascular: Negative for chest pain.  Gastrointestinal: Negative for nausea, vomiting and abdominal pain.  Genitourinary: Negative for difficulty urinating.  Musculoskeletal: Positive for arthralgias. Negative for back pain and neck stiffness.  Skin: Negative for rash.   Neurological: Positive for headaches (mild). Negative for syncope, weakness and numbness.      Allergies  Review of patient's allergies indicates no known allergies.  Home Medications   Prior to Admission medications   Medication Sig Start Date End Date Taking? Authorizing Provider  albuterol (PROVENTIL HFA;VENTOLIN HFA) 108 (90 BASE) MCG/ACT inhaler Inhale 2 puffs into the lungs every 6 (six) hours as needed for wheezing.    Historical Provider, MD  amitriptyline (ELAVIL) 10 MG tablet Take 10 mg by mouth at bedtime.    Historical Provider, MD  aspirin 81 MG tablet Take 81 mg by mouth daily.    Historical Provider, MD  b complex vitamins tablet Take 1 tablet by mouth daily.    Historical Provider, MD  beclomethasone (QVAR) 80 MCG/ACT inhaler Inhale 1 puff into the lungs as needed.    Historical Provider, MD  buPROPion (WELLBUTRIN XL) 300 MG 24 hr tablet Take 300 mg by mouth daily.    Historical Provider, MD  chlorpheniramine-phenylephrine-dextromethorphan (CARDEC DM) 3.5-1-3 MG/ML solution Take 5 mLs by mouth every 6 (six) hours as needed for cough (cough).    Historical Provider, MD  Fluticasone-Salmeterol (ADVAIR) 250-50 MCG/DOSE AEPB Inhale 1 puff into the lungs every 12 (twelve) hours.    Historical Provider, MD  gabapentin (NEURONTIN) 100 MG capsule Take 100 mg by mouth 2 (two) times daily.    Historical Provider, MD  losartan (COZAAR) 50 MG tablet Take 50 mg by mouth daily.    Historical Provider, MD  metoprolol succinate (TOPROL-XL) 25 MG 24 hr tablet  Take 25 mg by mouth 2 (two) times daily.    Historical Provider, MD  traZODone (DESYREL) 50 MG tablet Take 50 mg by mouth 2 (two) times daily.     Historical Provider, MD   BP 175/70 mmHg  Pulse 83  Temp(Src) 98 F (36.7 C) (Oral)  Resp 15  Ht 5\' 8"  (1.727 m)  Wt 220 lb (99.791 kg)  BMI 33.46 kg/m2  SpO2 99% Physical Exam  Constitutional: He is oriented to person, place, and time. He appears well-developed and well-nourished.  No distress.  HENT:  Head: Normocephalic and atraumatic.  Eyes: Conjunctivae and EOM are normal.  Neck: Normal range of motion.  Cardiovascular: Normal rate, regular rhythm, normal heart sounds and intact distal pulses.  Exam reveals no gallop and no friction rub.   No murmur heard. Pulmonary/Chest: Effort normal and breath sounds normal. No respiratory distress. He has no wheezes. He has no rales.  Abdominal: Soft. He exhibits no distension. There is no tenderness. There is no guarding.  Musculoskeletal: He exhibits no edema.       Right shoulder: He exhibits decreased range of motion (pain in humerus) and spasm.       Right elbow: He exhibits decreased range of motion (secondary to pain in humerus).       Cervical back: He exhibits no bony tenderness.       Thoracic back: He exhibits no bony tenderness.       Lumbar back: He exhibits no bony tenderness.       Right upper arm: He exhibits tenderness, bony tenderness, swelling and deformity. He exhibits no laceration.       Right forearm: He exhibits no tenderness, no bony tenderness, no swelling and no edema.       Right hand: He exhibits normal range of motion, no tenderness, no bony tenderness, normal capillary refill and no deformity. Normal sensation noted. Decreased sensation is not present in the ulnar distribution, is not present in the medial distribution and is not present in the radial distribution. Normal strength noted. He exhibits no finger abduction, no thumb/finger opposition and no wrist extension trouble.  Neurological: He is alert and oriented to person, place, and time.  Skin: Skin is warm and dry. He is not diaphoretic.  Nursing note and vitals reviewed.   ED Course  Procedures (including critical care time) Labs Review Labs Reviewed  CBC WITH DIFFERENTIAL  BASIC METABOLIC PANEL  PROTIME-INR    Imaging Review No results found.   EKG Interpretation None      MDM   Final diagnoses:  Fall    63 year old  male with a history of hypertension, CAD, TIA presents with concern of fall approximately down 15 stairs while at the [facility].  Patient reports right arm pain and mild headache however denies any neck pain, back pain, chest pain, abdominal pain and does not exhibit tenderness and there is and have low suspicion for injury. Given presence of distracting injury with history of head trauma head CT and cervical spine were done which showed no acute injuries and cervical spine was cleared.  Given patient does not have any chest pain, abdominal pain or tenderness and appears to be with exam have low suspicion for intrathoracic or intra-abdominal injury. CBC and BMP were within normal limits.  Patient with right humerus deformity and x-ray shows proximal displaced overriding right humerus fracture.  The fracture is closed, and patient is neurovascularly intact. Orthopedics was called and recommended splint, sling and follow-up  on Monday. Patient was given fentanyl, Dilaudid, Valium for pain control. He was discharged with a prescription for Percocet, Valium and Zofran and will follow up with Dr. Marlou Sa on Monday.  Alvino Chapel, MD 02/18/14 [place], MD 02/27/14 [phone]

## 2014-02-17 NOTE — Progress Notes (Signed)
Orthopedic Tech Progress Note Patient Details:  Wesley Beard [birth year] [id number]  Ortho Devices Type of Ortho Device: Shoulder immobilizer Ortho Device/Splint Location: RUE Ortho Device/Splint Interventions: Ordered, Application   Braulio Bosch 02/17/2014, 11:22 PM

## 2014-02-17 NOTE — Progress Notes (Signed)
Chaplain responded to level 2 trauma page. Pt stable, no family present. Page if needed.  Vanetta Mulders 02/17/2014 11:01 PM

## 2014-02-18 LAB — CBC WITH DIFFERENTIAL/PLATELET
Basophils Absolute: 0 10*3/uL (ref 0.0–0.1)
Basophils Relative: 0 % (ref 0–1)
EOS PCT: 5 % (ref 0–5)
Eosinophils Absolute: 0.5 10*3/uL (ref 0.0–0.7)
HCT: 41.2 % (ref 39.0–52.0)
Hemoglobin: 13.7 g/dL (ref 13.0–17.0)
Lymphocytes Relative: 36 % (ref 12–46)
Lymphs Abs: 3.8 10*3/uL (ref 0.7–4.0)
MCH: 30.6 pg (ref 26.0–34.0)
MCHC: 33.3 g/dL (ref 30.0–36.0)
MCV: 92 fL (ref 78.0–100.0)
MONO ABS: 0.7 10*3/uL (ref 0.1–1.0)
Monocytes Relative: 7 % (ref 3–12)
NEUTROS PCT: 52 % (ref 43–77)
Neutro Abs: 5.5 10*3/uL (ref 1.7–7.7)
PLATELETS: 155 10*3/uL (ref 150–400)
RBC: 4.48 MIL/uL (ref 4.22–5.81)
RDW: 13.5 % (ref 11.5–15.5)
WBC: 10.5 10*3/uL (ref 4.0–10.5)

## 2014-02-18 LAB — CBG MONITORING, ED: Glucose-Capillary: 123 mg/dL — ABNORMAL HIGH (ref 70–99)

## 2014-02-18 MED ORDER — HYDROMORPHONE HCL 1 MG/ML IJ SOLN
1.0000 mg | Freq: Once | INTRAMUSCULAR | Status: AC
  Administered 2014-02-18: 1 mg via INTRAVENOUS
  Filled 2014-02-18: qty 1

## 2014-02-18 MED ORDER — DIAZEPAM 5 MG/ML IJ SOLN
5.0000 mg | Freq: Once | INTRAMUSCULAR | Status: AC
  Administered 2014-02-18: 5 mg via INTRAVENOUS
  Filled 2014-02-18: qty 2

## 2014-02-18 MED ORDER — OXYCODONE-ACETAMINOPHEN 5-325 MG PO TABS: 2.0000 | ORAL_TABLET | ORAL | Status: DC | PRN

## 2014-02-18 MED ORDER — HYDROMORPHONE HCL 1 MG/ML IJ SOLN
1.0000 mg | INTRAMUSCULAR | Status: AC | PRN
  Administered 2014-02-18 (×2): 1 mg via INTRAVENOUS
  Filled 2014-02-18 (×2): qty 1

## 2014-02-18 MED ORDER — DIAZEPAM 5 MG PO TABS: 5.0000 mg | ORAL_TABLET | Freq: Every evening | ORAL | Status: DC | PRN

## 2014-02-18 MED ORDER — HYDROMORPHONE HCL 1 MG/ML IJ SOLN
0.5000 mg | Freq: Once | INTRAMUSCULAR | Status: AC
  Administered 2014-02-18: 0.5 mg via INTRAVENOUS
  Filled 2014-02-18: qty 1

## 2014-02-18 MED ORDER — ONDANSETRON 4 MG PO TBDP: 4.0000 mg | ORAL_TABLET | Freq: Three times a day (TID) | ORAL | Status: DC | PRN

## 2014-02-18 MED ORDER — OXYCODONE-ACETAMINOPHEN 5-325 MG PO TABS
1.0000 | ORAL_TABLET | Freq: Once | ORAL | Status: AC
  Administered 2014-02-18: 1 via ORAL
  Filled 2014-02-18: qty 1

## 2014-02-18 NOTE — ED Notes (Signed)
NRB 15 liters applied.  Instructed to take deep breaths.  Patient sleepy from the meds.  Dr. Jeneen Rinks aware.

## 2014-02-18 NOTE — ED Notes (Signed)
Repositioned for comfort.  O2 applied via [place] at 2L.  O2 sat up to 98%.

## 2014-02-18 NOTE — Discharge Instructions (Signed)
Call Dr. Marlou Sa (Orthopedics) on Monday appointment. You may be more comfortable sitting, rather than laying. Do not remove the splint or sling. Return to ER with any worsening symptoms.  Cast or Splint Care Casts and splints support injured limbs and keep bones from moving while they heal. It is important to care for your cast or splint at home.  HOME CARE INSTRUCTIONS  Keep the cast or splint uncovered during the drying period. It can take 24 to 48 hours to dry if it is made of plaster. A fiberglass cast will dry in less than 1 hour.  Do not rest the cast on anything harder than a pillow for the first 24 hours.  Do not put weight on your injured limb or apply pressure to the cast until your health care provider gives you permission.  Keep the cast or splint dry. Wet casts or splints can lose their shape and may not support the limb as well. A wet cast that has lost its shape can also create harmful pressure on your skin when it dries. Also, wet skin can become infected.  Cover the cast or splint with a plastic bag when bathing or when out in the rain or snow. If the cast is on the trunk of the body, take sponge baths until the cast is removed.  If your cast does become wet, dry it with a towel or a blow dryer on the cool setting only.  Keep your cast or splint clean. Soiled casts may be wiped with a moistened cloth.  Do not place any hard or soft foreign objects under your cast or splint, such as cotton, toilet paper, lotion, or powder.  Do not try to scratch the skin under the cast with any object. The object could get stuck inside the cast. Also, scratching could lead to an infection. If itching is a problem, use a blow dryer on a cool setting to relieve discomfort.  Do not trim or cut your cast or remove padding from inside of it.  Exercise all joints next to the injury that are not immobilized by the cast or splint. For example, if you have a long leg cast, exercise the hip joint  and toes. If you have an arm cast or splint, exercise the shoulder, elbow, thumb, and fingers.  Elevate your injured arm or leg on 1 or 2 pillows for the first 1 to 3 days to decrease swelling and pain.It is best if you can comfortably elevate your cast so it is higher than your heart. SEEK MEDICAL CARE IF:   Your cast or splint cracks.  Your cast or splint is too tight or too loose.  You have unbearable itching inside the cast.  Your cast becomes wet or develops a soft spot or area.  You have a bad smell coming from inside your cast.  You get an object stuck under your cast.  Your skin around the cast becomes red or raw.  You have new pain or worsening pain after the cast has been applied. SEEK IMMEDIATE MEDICAL CARE IF:   You have fluid leaking through the cast.  You are unable to move your fingers or toes.  You have discolored (blue or white), cool, painful, or very swollen fingers or toes beyond the cast.  You have tingling or numbness around the injured area.  You have severe pain or pressure under the cast.  You have any difficulty with your breathing or have shortness of breath.  You have chest  pain. Document Released: 01/24/2000 Document Revised: 11/16/2012 Document Reviewed: 08/04/2012 [facility] Patient Information 2015 [place], [place]. This information is not intended to replace advice given to you by your health care provider. Make sure you discuss any questions you have with your health care provider.  Humerus Fracture, Treated with Immobilization The humerus is the large bone in the upper arm. A broken (fractured) humerus is often treated by wearing a cast, splint, or sling (immobilization). This holds the broken pieces in place so they can heal.  HOME CARE  Put ice on the injured area.  Put ice in a plastic bag.  Place a towel between your skin and the bag.  Leave the ice on for 15-20 minutes, 03-04 times a day.  If you are given a cast:  Do not  scratch the skin under the cast.  Check the skin around the cast every day. You may put lotion on any red or sore areas.  Keep the cast dry and clean.  If you are given a splint:  Wear the splint as told.  Keep the splint clean and dry.  Loosen the elastic around the splint if your fingers become numb, cold, tingle, or turn blue.  If you are given a sling:  Wear the sling as told.  Do not put pressure on any part of the cast or splint until it is fully hardened.  The cast or splint must be protected with a plastic bag during bathing. Do not lower the cast or splint into water.  Only take medicine as told by your doctor.  Do exercises as told by your doctor.  Follow up as told by your doctor. GET HELP RIGHT AWAY IF:   Your skin or fingernails turn blue or gray.  Your arm feels cold or numb.  You have very bad pain in the injured arm.  You are having problems with the medicines you were given. MAKE SURE YOU:   Understand these instructions.  Will watch your condition.  Will get help right away if you are not doing well or get worse. Document Released: 07/15/2007 Document Revised: 04/20/2011 Document Reviewed: 03/12/2010 [facility] Patient Information 2015 [place], [place]. This information is not intended to replace advice given to you by your health care provider. Make sure you discuss any questions you have with your health care provider.

## 2014-02-18 NOTE — ED Notes (Signed)
Attempted to sit patient up.  Continues to have uncontrollable pain.  Dr Zenia Resides notified.  Orders received.

## 2014-02-18 NOTE — ED Notes (Signed)
Rating pain at 9/10 at this time.  States i'd rather have a pill so I can go home.  Medicated per order with Percocet.

## 2014-02-19 ENCOUNTER — Encounter (HOSPITAL_COMMUNITY): Payer: Self-pay | Admitting: *Deleted

## 2014-02-19 NOTE — H&P (Signed)
[facility] H&P  Chief Complaint:  R proximal humerus fracture HPI:   64 y/o male fell down stairs at the [facility] sustaining a R proximal humerus fracture with severe displacement.  Pt was seen at our office on 02/19/2013 after being referred by another orthopaedic surgeon.  Films were reviewed and pt examined. Determined that surgery was indicated. Pt presents today for repair of R proximal humerus.  Past Medical History  Diagnosis Date  . MI, old 2010  . Heart palpitations     Dr. Otho Perl  . Depression   . Hypertension   . Stroke 2005    some left side weakness, TIA'a after CVA - last 10/2013  . Sleep apnea     does not  use cpap  . GERD (gastroesophageal reflux disease)   . Arthritis   . Cancer     basal cell skin cancer  . Atrial fibrillation   . Retinal tear of right eye     Past Surgical History  Procedure Laterality Date  . Clavicle surgery Left   . Stomach surgery    . Cardiac catheterization  2010    normal  . Tonsillectomy    . Carpal tunnel release Bilateral   . Colonoscopy      Family History  Problem Relation Age of Onset  . Heart disease Mother   . Heart disease Father    Social History:  reports that he quit smoking about 7 years ago. He has never used smokeless tobacco. He reports that he drinks alcohol. He reports that he does not use illicit drugs.  Allergies: No Known Allergies  No current facility-administered medications on file prior to encounter.   Current Outpatient Prescriptions on File Prior to Encounter  Medication Sig Dispense Refill  . albuterol (PROVENTIL HFA;VENTOLIN HFA) 108 (90 BASE) MCG/ACT inhaler Inhale 2 puffs into the lungs every 6 (six) hours as needed for wheezing.    Marland Kitchen amitriptyline (ELAVIL) 10 MG tablet Take 10 mg by mouth at bedtime.    Marland Kitchen aspirin 81 MG tablet Take 81 mg by mouth daily.    Marland Kitchen b complex vitamins tablet Take 1 tablet by mouth daily.    . beclomethasone (QVAR) 80 MCG/ACT inhaler Inhale  1 puff into the lungs as needed.    Marland Kitchen buPROPion (WELLBUTRIN XL) 300 MG 24 hr tablet Take 300 mg by mouth daily.    . chlorpheniramine-phenylephrine-dextromethorphan (CARDEC DM) 3.5-1-3 MG/ML solution Take 5 mLs by mouth every 6 (six) hours as needed for cough (cough).    . diazepam (VALIUM) 5 MG tablet Take 1 tablet (5 mg total) by mouth at bedtime as needed for muscle spasms (or sleep). 10 tablet 0  . Fluticasone-Salmeterol (ADVAIR) 250-50 MCG/DOSE AEPB Inhale 1 puff into the lungs every 12 (twelve) hours.    . gabapentin (NEURONTIN) 100 MG capsule Take 100 mg by mouth 3 (three) times daily.     Marland Kitchen losartan (COZAAR) 50 MG tablet Take 50 mg by mouth daily.    . metoprolol succinate (TOPROL-XL) 25 MG 24 hr tablet Take 25 mg by mouth 2 (two) times daily.    . ondansetron (ZOFRAN ODT) 4 MG disintegrating tablet Take 1 tablet (4 mg total) by mouth every 8 (eight) hours as needed for nausea. 6 tablet 0  . oxyCODONE-acetaminophen (PERCOCET/ROXICET) 5-325 MG per tablet Take 2 tablets by mouth every 4 (four) hours as needed. 30 tablet 0  . traZODone (DESYREL) 50 MG tablet Take 50 mg by mouth 2 (two) times  daily.        Results for orders placed or performed during the hospital encounter of 02/17/14 (from the past 48 hour(s))  CBC with Differential     Status: None   Collection Time: 02/17/14 11:13 PM  Result Value Ref Range   WBC 10.5 4.0 - 10.5 K/uL   RBC 4.48 4.22 - 5.81 MIL/uL   Hemoglobin 13.7 13.0 - 17.0 g/dL   HCT 41.2 39.0 - 52.0 %   MCV 92.0 78.0 - 100.0 fL   MCH 30.6 26.0 - 34.0 pg   MCHC 33.3 30.0 - 36.0 g/dL   RDW 13.5 11.5 - 15.5 %   Platelets 155 150 - 400 K/uL   Neutrophils Relative % 52 43 - 77 %   Lymphocytes Relative 36 12 - 46 %   Monocytes Relative 7 3 - 12 %   Eosinophils Relative 5 0 - 5 %   Basophils Relative 0 0 - 1 %   Neutro Abs 5.5 1.7 - 7.7 K/uL   Lymphs Abs 3.8 0.7 - 4.0 K/uL   Monocytes Absolute 0.7 0.1 - 1.0 K/uL   Eosinophils Absolute 0.5 0.0 - 0.7 K/uL    Basophils Absolute 0.0 0.0 - 0.1 K/uL  Basic metabolic panel     Status: Abnormal   Collection Time: 02/17/14 11:13 PM  Result Value Ref Range   Sodium 139 135 - 145 mmol/L    Comment: Please note change in reference range.   Potassium 3.9 3.5 - 5.1 mmol/L    Comment: Please note change in reference range.   Chloride 105 96 - 112 mEq/L   CO2 23 19 - 32 mmol/L   Glucose, Bld 102 (H) 70 - 99 mg/dL   BUN 15 6 - 23 mg/dL   Creatinine, Ser 1.14 0.50 - 1.35 mg/dL   Calcium 8.9 8.4 - 10.5 mg/dL   GFR calc non Af Amer 66 (L) >90 mL/min   GFR calc Af Amer 76 (L) >90 mL/min    Comment: (NOTE) The eGFR has been calculated using the CKD EPI equation. This calculation has not been validated in all clinical situations. eGFR's persistently <90 mL/min signify possible Chronic Kidney Disease.    Anion gap 11 5 - 15  Protime-INR     Status: None   Collection Time: 02/17/14 11:13 PM  Result Value Ref Range   Prothrombin Time 12.9 11.6 - 15.2 seconds   INR 0.96 0.00 - 1.49  Ethanol     Status: None   Collection Time: 02/17/14 11:14 PM  Result Value Ref Range   Alcohol, Ethyl (B) <5 0 - 9 mg/dL    Comment:        LOWEST DETECTABLE LIMIT FOR SERUM ALCOHOL IS 11 mg/dL FOR MEDICAL PURPOSES ONLY   CBG monitoring, ED     Status: Abnormal   Collection Time: 02/18/14  1:28 AM  Result Value Ref Range   Glucose-Capillary 123 (H) 70 - 99 mg/dL   Ct Head Wo Contrast  02/18/2014   CLINICAL DATA:  Pt fell down approx 15 cement stairs at [employer].Rt arm pain was very severe and pt was unable to stop shaking on scanner  EXAM: CT HEAD WITHOUT CONTRAST  CT CERVICAL SPINE WITHOUT CONTRAST  TECHNIQUE: Multidetector CT imaging of the head and cervical spine was performed following the standard protocol without intravenous contrast. Multiplanar CT image reconstructions of the cervical spine were also generated.  COMPARISON:  None.  FINDINGS: CT HEAD FINDINGS  No intracranial hemorrhage.  No parenchymal  contusion. No midline shift or mass effect. Basilar cisterns are patent. No skull base fracture. No fluid in the paranasal sinuses or mastoid air cells. Orbits are normal.  Generalized cortical atrophy. Mild periventricular white matter hypodensities.  CT CERVICAL SPINE FINDINGS  No prevertebral soft tissue swelling. Normal alignment of cervical vertebral bodies. No loss of vertebral body height. Normal facet articulation. Normal craniocervical junction.  No evidence epidural or paraspinal hematoma.  IMPRESSION: 1. No acute intracranial trauma. 2. Atrophy and mild white matter microvascular disease. 3. No cervical spine fracture.   Electronically Signed   By: Suzy Bouchard M.D.   On: 02/18/2014 00:19   Ct Cervical Spine Wo Contrast  02/18/2014   CLINICAL DATA:  Pt fell down approx 15 cement stairs at [employer].Rt arm pain was very severe and pt was unable to stop shaking on scanner  EXAM: CT HEAD WITHOUT CONTRAST  CT CERVICAL SPINE WITHOUT CONTRAST  TECHNIQUE: Multidetector CT imaging of the head and cervical spine was performed following the standard protocol without intravenous contrast. Multiplanar CT image reconstructions of the cervical spine were also generated.  COMPARISON:  None.  FINDINGS: CT HEAD FINDINGS  No intracranial hemorrhage. No parenchymal contusion. No midline shift or mass effect. Basilar cisterns are patent. No skull base fracture. No fluid in the paranasal sinuses or mastoid air cells. Orbits are normal.  Generalized cortical atrophy. Mild periventricular white matter hypodensities.  CT CERVICAL SPINE FINDINGS  No prevertebral soft tissue swelling. Normal alignment of cervical vertebral bodies. No loss of vertebral body height. Normal facet articulation. Normal craniocervical junction.  No evidence epidural or paraspinal hematoma.  IMPRESSION: 1. No acute intracranial trauma. 2. Atrophy and mild white matter microvascular disease. 3. No cervical spine fracture.   Electronically  Signed   By: Suzy Bouchard M.D.   On: 02/18/2014 00:19   Dg Pelvis Portable  02/17/2014   CLINICAL DATA:  [place] down steps.  Right humeral fracture.  EXAM: PORTABLE PELVIS 1-2 VIEWS  COMPARISON:  None.  FINDINGS: There is no evidence of pelvic fracture or diastasis. No pelvic bone lesions are seen.  IMPRESSION: Negative.   Electronically Signed   By: Lucienne Capers M.D.   On: 02/17/2014 23:33   Dg Chest Port 1 View  02/17/2014   CLINICAL DATA:  Trauma. Right arm fracture. Chronic cough. Smoker.  EXAM: PORTABLE CHEST - 1 VIEW  COMPARISON:  None.  FINDINGS: Shallow inspiration. Cardiac enlargement with prominent pulmonary vascularity. This suggests Mild congestion. No edema or consolidation suggested. No blunting of costophrenic angles. No pneumothorax. Old fracture deformity of the left clavicle.  IMPRESSION: Cardiac enlargement with increased pulmonary vascularity suggesting mild vascular congestion.   Electronically Signed   By: Lucienne Capers M.D.   On: 02/17/2014 23:32   Dg Humerus Right  02/17/2014   CLINICAL DATA:  [place] down stairs today with swelling and deformity to the proximal humerus.  EXAM: RIGHT HUMERUS - 2+ VIEW  COMPARISON:  None.  FINDINGS: Comminuted fractures of the proximal right humeral shaft extending to the humeral neck. There is complete lateral displacement of the distal fracture fragment with additional displaced butterfly fragments. Overriding of the distal fracture fragment with respect to the proximal fragment. Distal humerus appears intact.  IMPRESSION: Comminuted displaced acute posttraumatic fracture of the proximal right humeral shaft.   Electronically Signed   By: Lucienne Capers M.D.   On: 02/17/2014 23:31    Review of Systems  Constitutional: Negative for fever and chills.  Respiratory: Negative for shortness of breath and wheezing.   Cardiovascular: Negative for chest pain and palpitations.  Gastrointestinal: Negative for nausea, vomiting and abdominal pain.   Neurological: Negative for tingling and sensory change.    Vitals to be obtained on arrival to pre-op Estimated body mass index is 33.46 kg/(m^2) as calculated from the following:   Height as of 02/17/14: 5' 8"  (1.727 m).   Weight as of 02/17/14: 99.791 kg (220 lb).  Physical Exam  Constitutional: He appears well-developed and well-nourished. He is cooperative.  Cardiovascular: S1 normal and S2 normal.   Respiratory: Effort normal. He has no wheezes.  GI: Soft. Normal appearance. He exhibits no distension. There is no tenderness.  Musculoskeletal:  Right Upper Extremity    + deformity R arm   + swelling    + pain with movement    + ecchymosis    R/U/M motor and sensory functions grossly intact    Ext warm      Neurological: He is alert.     Assessment/Plan  64 y/o male with comminuted and displaced R proximal humerus fracture with shaft extension   OR for ORIF  Admit after surgery for pain control and observation  PT/OT consults post op No active shoulder motion for approx 6 weeks, pendulums and gentle passive shoulder ROM to begin after surgery   Risks and benefits reviewed with pt and family, wishes to proceed   Jari Pigg, PA-C [facility] [phone] (P) 02/19/2014, 8:03 PM

## 2014-02-19 NOTE — Progress Notes (Signed)
Pt states his cardiologist is Dr. Abran Richard in [facility]. Faxed request for recent EKG, ?Echo, ?stress and cath if they have any results. Pt denies any chest pain or sob.  Recently treated for Bronchitis, finished antibiotic a week ago. States he has a chronic cough, but cough that he had with the bronchitis is gone.

## 2014-02-20 ENCOUNTER — Inpatient Hospital Stay (HOSPITAL_COMMUNITY)
Admission: RE | Admit: 2014-02-20 | Payer: Worker's Compensation | Source: Ambulatory Visit | Admitting: Orthopedic Surgery

## 2014-02-20 HISTORY — DX: Horseshoe tear of retina without detachment, right eye: H33.311

## 2014-02-20 HISTORY — DX: Gastro-esophageal reflux disease without esophagitis: K21.9

## 2014-02-20 HISTORY — DX: Unspecified atrial fibrillation: I48.91

## 2014-02-20 HISTORY — DX: Unspecified osteoarthritis, unspecified site: M19.90

## 2014-02-20 HISTORY — DX: Malignant (primary) neoplasm, unspecified: C80.1

## 2014-02-20 HISTORY — DX: Essential (primary) hypertension: I10

## 2014-02-20 HISTORY — DX: Sleep apnea, unspecified: G47.30

## 2014-02-20 SURGERY — OPEN REDUCTION INTERNAL FIXATION (ORIF) PROXIMAL HUMERUS FRACTURE
Anesthesia: General | Laterality: Right

## 2014-02-20 NOTE — Progress Notes (Signed)
Called pt to instruct him on calling the OR in AM for time of surgery. Pt didn't answer the phone, so I called and left a message on his son, Jamie's phone. As soon as I hung up, Mr. Drab returned my call. I told him that he needed to call in the AM for surgery start time and he stated that Dr. Luanna Cole office had told him it would be 8 PM tomorrow. I told him he still needed to call the OR in the AM to get the exact time and then to be here 2 and 1/2 hours prior to surgery start time. He voiced understanding. He states he was told by Dr. Luanna Cole office that he could have a light breakfast in the AM, then NPO after that.

## 2014-02-21 ENCOUNTER — Encounter (HOSPITAL_COMMUNITY): Payer: Self-pay | Admitting: *Deleted

## 2014-02-21 ENCOUNTER — Encounter (HOSPITAL_COMMUNITY)
Admission: RE | Disposition: A | Discharge status: Skilled Nursing Facility | Payer: Self-pay | Source: Ambulatory Visit | Attending: Orthopedic Surgery

## 2014-02-21 ENCOUNTER — Ambulatory Visit (HOSPITAL_COMMUNITY): Payer: Worker's Compensation | Admitting: Anesthesiology

## 2014-02-21 ENCOUNTER — Inpatient Hospital Stay (HOSPITAL_COMMUNITY)
Admission: RE | Admit: 2014-02-21 | Discharge: 2014-02-24 | DRG: 493 | Disposition: A | Discharge status: Skilled Nursing Facility | Payer: Worker's Compensation | Source: Ambulatory Visit | Attending: Orthopedic Surgery | Admitting: Orthopedic Surgery

## 2014-02-21 DIAGNOSIS — Z85828 Personal history of other malignant neoplasm of skin: Secondary | ICD-10-CM

## 2014-02-21 DIAGNOSIS — Z9889 Other specified postprocedural states: Secondary | ICD-10-CM

## 2014-02-21 DIAGNOSIS — S42201A Unspecified fracture of upper end of right humerus, initial encounter for closed fracture: Principal | ICD-10-CM | POA: Diagnosis present

## 2014-02-21 DIAGNOSIS — Z7982 Long term (current) use of aspirin: Secondary | ICD-10-CM

## 2014-02-21 DIAGNOSIS — S42309A Unspecified fracture of shaft of humerus, unspecified arm, initial encounter for closed fracture: Secondary | ICD-10-CM

## 2014-02-21 DIAGNOSIS — Z8781 Personal history of (healed) traumatic fracture: Secondary | ICD-10-CM

## 2014-02-21 DIAGNOSIS — S42301A Unspecified fracture of shaft of humerus, right arm, initial encounter for closed fracture: Secondary | ICD-10-CM | POA: Diagnosis present

## 2014-02-21 DIAGNOSIS — I252 Old myocardial infarction: Secondary | ICD-10-CM

## 2014-02-21 DIAGNOSIS — I10 Essential (primary) hypertension: Secondary | ICD-10-CM | POA: Diagnosis present

## 2014-02-21 DIAGNOSIS — I69354 Hemiplegia and hemiparesis following cerebral infarction affecting left non-dominant side: Secondary | ICD-10-CM

## 2014-02-21 DIAGNOSIS — G4730 Sleep apnea, unspecified: Secondary | ICD-10-CM | POA: Diagnosis present

## 2014-02-21 DIAGNOSIS — K219 Gastro-esophageal reflux disease without esophagitis: Secondary | ICD-10-CM | POA: Diagnosis present

## 2014-02-21 DIAGNOSIS — Y9229 Other specified public building as the place of occurrence of the external cause: Secondary | ICD-10-CM

## 2014-02-21 DIAGNOSIS — S4290XA Fracture of unspecified shoulder girdle, part unspecified, initial encounter for closed fracture: Secondary | ICD-10-CM | POA: Diagnosis present

## 2014-02-21 DIAGNOSIS — Z87891 Personal history of nicotine dependence: Secondary | ICD-10-CM

## 2014-02-21 DIAGNOSIS — Z79899 Other long term (current) drug therapy: Secondary | ICD-10-CM

## 2014-02-21 DIAGNOSIS — F329 Major depressive disorder, single episode, unspecified: Secondary | ICD-10-CM | POA: Diagnosis present

## 2014-02-21 DIAGNOSIS — W109XXA Fall (on) (from) unspecified stairs and steps, initial encounter: Secondary | ICD-10-CM | POA: Diagnosis present

## 2014-02-21 DIAGNOSIS — I4891 Unspecified atrial fibrillation: Secondary | ICD-10-CM | POA: Diagnosis present

## 2014-02-21 DIAGNOSIS — D62 Acute posthemorrhagic anemia: Secondary | ICD-10-CM | POA: Diagnosis not present

## 2014-02-21 HISTORY — PX: ORIF HUMERUS FRACTURE: SHX2126

## 2014-02-21 HISTORY — DX: Unspecified fracture of upper end of right humerus, initial encounter for closed fracture: S42.201A

## 2014-02-21 SURGERY — OPEN REDUCTION INTERNAL FIXATION (ORIF) PROXIMAL HUMERUS FRACTURE
Anesthesia: General | Site: Shoulder | Laterality: Right

## 2014-02-21 MED ORDER — SUCCINYLCHOLINE CHLORIDE 20 MG/ML IJ SOLN
INTRAMUSCULAR | Status: DC | PRN
  Administered 2014-02-21: 140 mg via INTRAVENOUS

## 2014-02-21 MED ORDER — CEFAZOLIN SODIUM-DEXTROSE 2-3 GM-% IV SOLR
2.0000 g | INTRAVENOUS | Status: AC
  Administered 2014-02-21: 2 g via INTRAVENOUS

## 2014-02-21 MED ORDER — PHENYLEPHRINE HCL 10 MG/ML IJ SOLN
10.0000 mg | INTRAVENOUS | Status: DC | PRN
  Administered 2014-02-21: 25 ug/min via INTRAVENOUS

## 2014-02-21 MED ORDER — FENTANYL CITRATE 0.05 MG/ML IJ SOLN
INTRAMUSCULAR | Status: DC | PRN
  Administered 2014-02-21 (×2): 100 ug via INTRAVENOUS
  Administered 2014-02-21: 50 ug via INTRAVENOUS

## 2014-02-21 MED ORDER — PROPOFOL 10 MG/ML IV BOLUS
INTRAVENOUS | Status: AC
  Filled 2014-02-21: qty 20

## 2014-02-21 MED ORDER — EPHEDRINE SULFATE 50 MG/ML IJ SOLN
INTRAMUSCULAR | Status: AC
  Filled 2014-02-21: qty 1

## 2014-02-21 MED ORDER — 0.9 % SODIUM CHLORIDE (POUR BTL) OPTIME
TOPICAL | Status: DC | PRN
  Administered 2014-02-21: 1000 mL

## 2014-02-21 MED ORDER — ROCURONIUM BROMIDE 50 MG/5ML IV SOLN
INTRAVENOUS | Status: AC
Start: 2014-02-21 — End: 2014-02-21
  Filled 2014-02-21: qty 1

## 2014-02-21 MED ORDER — PHENYLEPHRINE HCL 10 MG/ML IJ SOLN
INTRAMUSCULAR | Status: DC | PRN
  Administered 2014-02-21 (×3): 80 ug via INTRAVENOUS

## 2014-02-21 MED ORDER — LIDOCAINE HCL (CARDIAC) 20 MG/ML IV SOLN
INTRAVENOUS | Status: DC | PRN
  Administered 2014-02-21: 100 mg via INTRAVENOUS

## 2014-02-21 MED ORDER — LACTATED RINGERS IV SOLN
INTRAVENOUS | Status: DC
  Administered 2014-02-21 – 2014-02-22 (×4): via INTRAVENOUS

## 2014-02-21 MED ORDER — MIDAZOLAM HCL 2 MG/2ML IJ SOLN
INTRAMUSCULAR | Status: DC | PRN
Start: 2014-02-21 — End: 2014-02-21

## 2014-02-21 MED ORDER — SODIUM CHLORIDE 0.9 % IJ SOLN
INTRAMUSCULAR | Status: AC
  Filled 2014-02-21: qty 10

## 2014-02-21 MED ORDER — FENTANYL CITRATE 0.05 MG/ML IJ SOLN
INTRAMUSCULAR | Status: AC
  Filled 2014-02-21: qty 5

## 2014-02-21 MED ORDER — PROPOFOL 10 MG/ML IV BOLUS
INTRAVENOUS | Status: DC | PRN
  Administered 2014-02-21: 200 mg via INTRAVENOUS
  Administered 2014-02-21: 50 mg via INTRAVENOUS

## 2014-02-21 MED ORDER — MIDAZOLAM HCL 2 MG/2ML IJ SOLN
INTRAMUSCULAR | Status: AC
  Filled 2014-02-21: qty 2

## 2014-02-21 SURGICAL SUPPLY — 68 items
BANDAGE ELASTIC 4 VELCRO ST LF (GAUZE/BANDAGES/DRESSINGS) ×3 IMPLANT
BANDAGE ELASTIC 6 VELCRO ST LF (GAUZE/BANDAGES/DRESSINGS) ×3 IMPLANT
BENZOIN TINCTURE PRP APPL 2/3 (GAUZE/BANDAGES/DRESSINGS) ×3 IMPLANT
BIT DRILL 2.8X4 QC CORT (BIT) ×3 IMPLANT
BIT DRILL 4 LONG FAST STEP (BIT) ×3 IMPLANT
BIT DRILL 4 SHORT FAST STEP (BIT) ×3 IMPLANT
CLEANER TIP ELECTROSURG 2X2 (MISCELLANEOUS) IMPLANT
CLOSURE STERI-STRIP 1/2X4 (GAUZE/BANDAGES/DRESSINGS) ×2
CLSR STERI-STRIP ANTIMIC 1/2X4 (GAUZE/BANDAGES/DRESSINGS) ×4 IMPLANT
COVER SURGICAL LIGHT HANDLE (MISCELLANEOUS) ×3 IMPLANT
DRAPE C-ARM 42X72 X-RAY (DRAPES) ×3 IMPLANT
DRAPE IMP U-DRAPE 54X76 (DRAPES) ×3 IMPLANT
DRAPE INCISE IOBAN 66X45 STRL (DRAPES) ×3 IMPLANT
DRAPE U-SHAPE 47X51 STRL (DRAPES) ×3 IMPLANT
DRSG AQUACEL AG ADV 3.5X10 (GAUZE/BANDAGES/DRESSINGS) ×6 IMPLANT
DRSG PAD ABDOMINAL 8X10 ST (GAUZE/BANDAGES/DRESSINGS) ×6 IMPLANT
DURAPREP 26ML APPLICATOR (WOUND CARE) ×3 IMPLANT
ELECT REM PT RETURN 9FT ADLT (ELECTROSURGICAL) ×3
ELECTRODE REM PT RTRN 9FT ADLT (ELECTROSURGICAL) ×1 IMPLANT
GLOVE BIOGEL PI IND STRL 7.5 (GLOVE) ×2 IMPLANT
GLOVE BIOGEL PI INDICATOR 7.5 (GLOVE) ×4
GLOVE BIOGEL PI ORTHO PRO SZ8 (GLOVE) ×2
GLOVE ORTHO TXT STRL SZ7.5 (GLOVE) ×6 IMPLANT
GLOVE PI ORTHO PRO STRL SZ8 (GLOVE) ×1 IMPLANT
GLOVE SURG ORTHO 8.0 STRL STRW (GLOVE) ×6 IMPLANT
GLOVE SURG SS PI 7.5 STRL IVOR (GLOVE) ×6 IMPLANT
GOWN STRL REUS W/ TWL LRG LVL3 (GOWN DISPOSABLE) ×1 IMPLANT
GOWN STRL REUS W/TWL LRG LVL3 (GOWN DISPOSABLE) ×2
KIT BASIN OR (CUSTOM PROCEDURE TRAY) ×3 IMPLANT
KIT ROOM TURNOVER OR (KITS) ×3 IMPLANT
MANIFOLD NEPTUNE II (INSTRUMENTS) ×3 IMPLANT
NEEDLE HYPO 25GX1X1/2 BEV (NEEDLE) ×3 IMPLANT
NS IRRIG 1000ML POUR BTL (IV SOLUTION) ×3 IMPLANT
PACK SHOULDER (CUSTOM PROCEDURE TRAY) ×3 IMPLANT
PACK UNIVERSAL I (CUSTOM PROCEDURE TRAY) ×3 IMPLANT
PAD ARMBOARD 7.5X6 YLW CONV (MISCELLANEOUS) ×6 IMPLANT
PAD CAST 4YDX4 CTTN HI CHSV (CAST SUPPLIES) ×1 IMPLANT
PADDING CAST COTTON 4X4 STRL (CAST SUPPLIES) ×2
PADDING CAST COTTON 6X4 STRL (CAST SUPPLIES) ×3 IMPLANT
PEG STND 4.0X40MM (Orthopedic Implant) ×15 IMPLANT
PEG STND 4.0X45.0MM (Orthopedic Implant) ×3 IMPLANT
PEGSTD 4.0X40MM (Orthopedic Implant) ×5 IMPLANT
PEGSTD 4.0X45.0MM (Orthopedic Implant) ×1 IMPLANT
PIN GUIDE SHOULDER 2.0MM (PIN) ×6 IMPLANT
PLATE SHOULDER S3 11HOLE RT (Plate) ×3 IMPLANT
SCREW MULTIDIR 3.8X26 HUMRL (Screw) ×3 IMPLANT
SCREW MULTIDIR 3.8X30 HUMRL (Screw) ×6 IMPLANT
SCREW MULTIDIR 3.8X32 HUMRL (Screw) ×9 IMPLANT
SCREW MULTIDIR 3.8X36 HUMRL (Screw) ×3 IMPLANT
SLING ARM FOAM STRAP XLG (SOFTGOODS) ×3 IMPLANT
SPLINT PLASTER CAST XFAST 5X30 (CAST SUPPLIES) ×1 IMPLANT
SPLINT PLASTER XFAST SET 5X30 (CAST SUPPLIES) ×2
SPONGE LAP 4X18 X RAY DECT (DISPOSABLE) ×6 IMPLANT
STAPLER VISISTAT 35W (STAPLE) ×3 IMPLANT
SUCTION FRAZIER TIP 10 FR DISP (SUCTIONS) ×3 IMPLANT
SUPPORT WRAP ARM LG (MISCELLANEOUS) ×3 IMPLANT
SUT FIBERWIRE #2 38 T-5 BLUE (SUTURE) ×3
SUT MNCRL AB 4-0 PS2 18 (SUTURE) ×3 IMPLANT
SUT VIC AB 0 CTB1 27 (SUTURE) ×6 IMPLANT
SUT VIC AB 2-0 CT1 27 (SUTURE) ×4
SUT VIC AB 2-0 CT1 TAPERPNT 27 (SUTURE) ×2 IMPLANT
SUT VIC AB 3-0 FS2 27 (SUTURE) ×6 IMPLANT
SUTURE FIBERWR #2 38 T-5 BLUE (SUTURE) ×1 IMPLANT
SYR BULB IRRIGATION 50ML (SYRINGE) ×3 IMPLANT
SYR CONTROL 10ML LL (SYRINGE) ×3 IMPLANT
TOWEL OR 17X24 6PK STRL BLUE (TOWEL DISPOSABLE) ×3 IMPLANT
TOWEL OR 17X26 10 PK STRL BLUE (TOWEL DISPOSABLE) ×3 IMPLANT
WATER STERILE IRR 1000ML POUR (IV SOLUTION) ×3 IMPLANT

## 2014-02-21 NOTE — Anesthesia Procedure Notes (Addendum)
Anesthesia Regional Block:  Interscalene brachial plexus block  Pre-Anesthetic Checklist: ,, timeout performed, Correct Patient, Correct Site, Correct Laterality, Correct Procedure, Correct Position, site marked, Risks and benefits discussed,  Surgical consent,  Pre-op evaluation,  At surgeon's request and post-op pain management  Laterality: Right  Prep: Maximum Sterile Barrier Precautions used, chloraprep and alcohol swabs       Needles:  Injection technique: Single-shot  Needle Type: Stimulator Needle - 40        Needle insertion depth: 4 cm   Additional Needles:  Procedures: nerve stimulator Interscalene brachial plexus block  Nerve Stimulator or Paresthesia:  Response: 0.5 mA, 0.1 ms, 4 cm  Additional Responses:   Narrative:  Start time: 02/21/2014 8:30 PM End time: 02/21/2014 8:35 PM Injection made incrementally with aspirations every 5 mL.  Performed by: Personally  Anesthesiologist: Kate Sable  Additional Notes: Pt accepts procedure w/ risks. 20cc 0.5% Marcaine w/ epi w/o difficulty or discomfort. GES   Procedure Name: Intubation Date/Time: 02/21/2014 8:54 PM Performed by: Valetta Fuller Pre-anesthesia Checklist: Patient identified, Emergency Drugs available, Suction available and Patient being monitored Patient Re-evaluated:Patient Re-evaluated prior to inductionOxygen Delivery Method: Circle system utilized Preoxygenation: Pre-oxygenation with 100% oxygen Intubation Type: IV induction, Rapid sequence and Cricoid Pressure applied Laryngoscope Size: Miller and 2 Tube type: Oral Tube size: 7.5 mm Number of attempts: 1 Airway Equipment and Method: Stylet Placement Confirmation: ETT inserted through vocal cords under direct vision,  positive ETCO2 and breath sounds checked- equal and bilateral Secured at: 23 cm Tube secured with: Tape Dental Injury: Teeth and Oropharynx as per pre-operative assessment

## 2014-02-21 NOTE — Anesthesia Preprocedure Evaluation (Signed)
Anesthesia Evaluation  Patient identified by MRN, date of birth, ID band Patient awake    Reviewed: Allergy & Precautions, NPO status , Patient's Chart, lab work & pertinent test results  Airway        Dental   Pulmonary sleep apnea , former smoker,          Cardiovascular hypertension, + CAD and + Past MI + dysrhythmias Atrial Fibrillation     Neuro/Psych Depression TIACVA, Residual Symptoms    GI/Hepatic GERD-  ,  Endo/Other    Renal/GU      Musculoskeletal  (+) Arthritis -,   Abdominal   Peds  Hematology   Anesthesia Other Findings   Reproductive/Obstetrics                             Anesthesia Physical Anesthesia Plan  ASA: III  Anesthesia Plan: General   Post-op Pain Management:    Induction: Intravenous  Airway Management Planned: Oral ETT  Additional Equipment:   Intra-op Plan:   Post-operative Plan: Extubation in OR  Informed Consent: I have reviewed the patients History and Physical, chart, labs and discussed the procedure including the risks, benefits and alternatives for the proposed anesthesia with the patient or authorized representative who has indicated his/her understanding and acceptance.     Plan Discussed with: CRNA, Anesthesiologist and Surgeon  Anesthesia Plan Comments:         Anesthesia Quick Evaluation

## 2014-02-21 NOTE — Progress Notes (Signed)
Unable to locate cardiology note or EKG from Dr. Hubbard Robinson office from PAT, office called, was told sending info on hand to [phone] now. Office denies having any record of stress test or ECHO on file.

## 2014-02-21 NOTE — H&P (Signed)
PREOPERATIVE H&P  Chief Complaint: RIGHT PROXIMAL HUMERUS FRACTURE AND SHAFT FRACTURE  HPI: Wesley Beard is a 64 y.o. male who presents for preoperative history and physical with a diagnosis of RIGHT PROXIMAL HUMERUS FRACTURE AND SHAFT FRACTURE. Symptoms are rated as moderate to severe, and have been worsening.  This is significantly impairing activities of daily living.  He has elected for surgical management. He originally fell downstairs at the [employer]. Initially he was scheduled for surgery with Dr. Marcelino Scot, however his workman's compensation insurance carrier did not authorize this, and so he was referred to me for definitive management. Original injury was January 9.  Past Medical History  Diagnosis Date  . MI, old 2010  . Heart palpitations     Dr. Otho Perl  . Depression   . Hypertension   . Stroke 2005    some left side weakness, TIA'a after CVA - last 10/2013  . Sleep apnea     does not  use cpap  . GERD (gastroesophageal reflux disease)   . Arthritis   . Cancer     basal cell skin cancer  . Atrial fibrillation   . Retinal tear of right eye    Past Surgical History  Procedure Laterality Date  . Clavicle surgery Left   . Stomach surgery    . Cardiac catheterization  2010    normal  . Tonsillectomy    . Carpal tunnel release Bilateral   . Colonoscopy     History   Social History  . Marital Status: Single    Spouse Name: N/A    Number of Children: N/A  . Years of Education: N/A   Social History Main Topics  . Smoking status: Former Smoker    Quit date: 09/29/2006  . Smokeless tobacco: Never Used  . Alcohol Use: Yes     Comment: occ  . Drug Use: No  . Sexual Activity: No   Other Topics Concern  . None   Social History Narrative   Family History  Problem Relation Age of Onset  . Heart disease Mother   . Heart disease Father    No Known Allergies Prior to Admission medications   Medication Sig Start Date End Date Taking? Authorizing  Provider  albuterol (PROVENTIL HFA;VENTOLIN HFA) 108 (90 BASE) MCG/ACT inhaler Inhale 2 puffs into the lungs every 6 (six) hours as needed for wheezing.   Yes Historical Provider, MD  allopurinol (ZYLOPRIM) 100 MG tablet Take 100 mg by mouth daily. 01/27/14  Yes Historical Provider, MD  amitriptyline (ELAVIL) 10 MG tablet Take 10 mg by mouth at bedtime.   Yes Historical Provider, MD  aspirin 81 MG tablet Take 81 mg by mouth daily.   Yes Historical Provider, MD  azithromycin (ZITHROMAX) 250 MG tablet  02/07/14  Yes Historical Provider, MD  b complex vitamins tablet Take 1 tablet by mouth daily.   Yes Historical Provider, MD  beclomethasone (QVAR) 80 MCG/ACT inhaler Inhale 1 puff into the lungs as needed.   Yes Historical Provider, MD  chlorpheniramine-phenylephrine-dextromethorphan (CARDEC DM) 3.5-1-3 MG/ML solution Take 5 mLs by mouth every 6 (six) hours as needed for cough (cough).   Yes Historical Provider, MD  diazepam (VALIUM) 5 MG tablet Take 1 tablet (5 mg total) by mouth at bedtime as needed for muscle spasms (or sleep). 02/18/14  Yes Tanna Furry, MD  gabapentin (NEURONTIN) 100 MG capsule Take 100 mg by mouth 3 (three) times daily.    Yes Historical Provider, MD  metoprolol succinate (TOPROL-XL)  25 MG 24 hr tablet Take 25 mg by mouth 2 (two) times daily.   Yes Historical Provider, MD  omeprazole (PRILOSEC) 20 MG capsule Take 20 mg by mouth daily. 02/07/14  Yes Historical Provider, MD  oxyCODONE-acetaminophen (PERCOCET/ROXICET) 5-325 MG per tablet Take 2 tablets by mouth every 4 (four) hours as needed. 02/18/14  Yes Tanna Furry, MD  senna (SENOKOT) 8.6 MG tablet Take 1 tablet by mouth daily.   Yes Historical Provider, MD  traZODone (DESYREL) 50 MG tablet Take 50 mg by mouth 2 (two) times daily.    Yes Historical Provider, MD  acetic acid (VOSOL) 2 % otic solution  12/12/13   Historical Provider, MD  buPROPion (WELLBUTRIN XL) 300 MG 24 hr tablet Take 300 mg by mouth daily.    Historical Provider, MD   CHERATUSSIN AC 100-10 MG/5ML syrup  02/07/14   Historical Provider, MD  colchicine 0.6 MG tablet  12/13/13   Historical Provider, MD  Fluticasone-Salmeterol (ADVAIR) 250-50 MCG/DOSE AEPB Inhale 1 puff into the lungs every 12 (twelve) hours.    Historical Provider, MD  HYDROcodone-ibuprofen (VICOPROFEN) 7.5-200 MG per tablet  12/12/13   Historical Provider, MD  losartan (COZAAR) 50 MG tablet Take 50 mg by mouth daily.    Historical Provider, MD  metoprolol tartrate (LOPRESSOR) 25 MG tablet Take 25 mg by mouth 2 (two) times daily as needed. 12/21/13   Historical Provider, MD  ondansetron (ZOFRAN ODT) 4 MG disintegrating tablet Take 1 tablet (4 mg total) by mouth every 8 (eight) hours as needed for nausea. 02/18/14   Tanna Furry, MD     Positive ROS: All other systems have been reviewed and were otherwise negative with the exception of those mentioned in the HPI and as above.  Physical Exam: General: Alert, no acute distress Cardiovascular: No pedal edema Respiratory: No cyanosis, no use of accessory musculature GI: No organomegaly, abdomen is soft and non-tender Skin: No lesions in the area of chief complaint Neurologic: Sensation intact distally Psychiatric: Patient is competent for consent with normal mood and affect Lymphatic: No axillary or cervical lymphadenopathy  MUSCULOSKELETAL: right shoulder has significant ecchymosis, unable to lift the arm, all fingers do flex extend and abduct. Sensation grossly intact distally.  X-rays demonstrate a comminuted proximal humerus fracture that extends down into the shaft.  Assessment: Comminuted right proximal humerus fracture with extension down into the shaft, history of multiple coexisting risk factors as indicated above.  Plan: Plan for Procedure(s): OPEN REDUCTION INTERNAL FIXATION (ORIF) PROXIMAL HUMERUS FRACTURE AND SHAFT  The risks benefits and alternatives were discussed with the patient including but not limited to the risks of  nonoperative treatment, versus surgical intervention including infection, bleeding, nerve injury, malunion, nonunion, the need for revision surgery, hardware prominence, hardware failure, the need for hardware removal, blood clots, cardiopulmonary complications, morbidity, mortality, among others, and they were willing to proceed.     Johnny Bridge, MD Cell [phone]   02/21/2014 8:30 PM

## 2014-02-21 NOTE — Discharge Instructions (Signed)
Diet: As you were doing prior to hospitalization   Shower:  May shower but keep the wounds dry, use an occlusive plastic wrap, NO SOAKING IN TUB.  If the bandage gets wet, change with a clean dry gauze.  Dressing:  You may change your dressing 3-5 days after surgery.  Then change the dressing daily with sterile gauze dressing.    There are sticky tapes (steri-strips) on your wounds and all the stitches are absorbable.  Leave the steri-strips in place when changing your dressings, they will peel off with time, usually 2-3 weeks.  Activity:  Increase activity slowly as tolerated, but follow the weight bearing instructions below.  No lifting or driving for 6 weeks.  Weight Bearing:   Sling at all times.  Ok for hand wrist and elbow motion.  .    To prevent constipation: you may use a stool softener such as -  Colace (over the counter) 100 mg by mouth twice a day  Drink plenty of fluids (prune juice may be helpful) and high fiber foods Miralax (over the counter) for constipation as needed.    Itching:  If you experience itching with your medications, try taking only a single pain pill, or even half a pain pill at a time.  You may take up to 10 pain pills per day, and you can also use benadryl over the counter for itching or also to help with sleep.   Precautions:  If you experience chest pain or shortness of breath - call 911 immediately for transfer to the hospital emergency department!!  If you develop a fever greater that 101 F, purulent drainage from wound, increased redness or drainage from wound, or calf pain -- Call the office at [phone]                                                Follow- Up Appointment:  Please call for an appointment to be seen in 2 weeks [place] - [phone]

## 2014-02-22 ENCOUNTER — Ambulatory Visit (HOSPITAL_COMMUNITY): Payer: Worker's Compensation

## 2014-02-22 ENCOUNTER — Inpatient Hospital Stay (HOSPITAL_COMMUNITY): Payer: Worker's Compensation

## 2014-02-22 DIAGNOSIS — Z7982 Long term (current) use of aspirin: Secondary | ICD-10-CM | POA: Diagnosis not present

## 2014-02-22 DIAGNOSIS — K219 Gastro-esophageal reflux disease without esophagitis: Secondary | ICD-10-CM | POA: Diagnosis present

## 2014-02-22 DIAGNOSIS — S42301A Unspecified fracture of shaft of humerus, right arm, initial encounter for closed fracture: Secondary | ICD-10-CM | POA: Diagnosis present

## 2014-02-22 DIAGNOSIS — W109XXA Fall (on) (from) unspecified stairs and steps, initial encounter: Secondary | ICD-10-CM | POA: Diagnosis present

## 2014-02-22 DIAGNOSIS — S42201A Unspecified fracture of upper end of right humerus, initial encounter for closed fracture: Secondary | ICD-10-CM | POA: Diagnosis present

## 2014-02-22 DIAGNOSIS — G4730 Sleep apnea, unspecified: Secondary | ICD-10-CM | POA: Diagnosis present

## 2014-02-22 DIAGNOSIS — I252 Old myocardial infarction: Secondary | ICD-10-CM | POA: Diagnosis not present

## 2014-02-22 DIAGNOSIS — I69354 Hemiplegia and hemiparesis following cerebral infarction affecting left non-dominant side: Secondary | ICD-10-CM | POA: Diagnosis not present

## 2014-02-22 DIAGNOSIS — Z79899 Other long term (current) drug therapy: Secondary | ICD-10-CM | POA: Diagnosis not present

## 2014-02-22 DIAGNOSIS — D62 Acute posthemorrhagic anemia: Secondary | ICD-10-CM | POA: Diagnosis not present

## 2014-02-22 DIAGNOSIS — I4891 Unspecified atrial fibrillation: Secondary | ICD-10-CM | POA: Diagnosis present

## 2014-02-22 DIAGNOSIS — Z87891 Personal history of nicotine dependence: Secondary | ICD-10-CM | POA: Diagnosis not present

## 2014-02-22 DIAGNOSIS — I10 Essential (primary) hypertension: Secondary | ICD-10-CM | POA: Diagnosis present

## 2014-02-22 DIAGNOSIS — F329 Major depressive disorder, single episode, unspecified: Secondary | ICD-10-CM | POA: Diagnosis present

## 2014-02-22 DIAGNOSIS — S4290XA Fracture of unspecified shoulder girdle, part unspecified, initial encounter for closed fracture: Secondary | ICD-10-CM | POA: Diagnosis present

## 2014-02-22 DIAGNOSIS — Y9229 Other specified public building as the place of occurrence of the external cause: Secondary | ICD-10-CM | POA: Diagnosis not present

## 2014-02-22 DIAGNOSIS — Z85828 Personal history of other malignant neoplasm of skin: Secondary | ICD-10-CM | POA: Diagnosis not present

## 2014-02-22 LAB — BASIC METABOLIC PANEL
ANION GAP: 12 (ref 5–15)
BUN: 11 mg/dL (ref 6–23)
CO2: 30 mmol/L (ref 19–32)
CREATININE: 0.85 mg/dL (ref 0.50–1.35)
Calcium: 8.2 mg/dL — ABNORMAL LOW (ref 8.4–10.5)
Chloride: 97 mEq/L (ref 96–112)
GFR calc Af Amer: >90 mL/min (ref >90)
GFR calc non Af Amer: 89 mL/min — ABNORMAL LOW (ref >90)
GLUCOSE: 130 mg/dL — AB (ref 70–99)
POTASSIUM: 3.9 mmol/L (ref 3.5–5.1)
SODIUM: 139 mmol/L (ref 135–145)

## 2014-02-22 LAB — CBC
HCT: 27.3 % — ABNORMAL LOW (ref 39.0–52.0)
Hemoglobin: 8.9 g/dL — ABNORMAL LOW (ref 13.0–17.0)
MCH: 31.3 pg (ref 26.0–34.0)
MCHC: 32.6 g/dL (ref 30.0–36.0)
MCV: 96.1 fL (ref 78.0–100.0)
Platelets: 191 10*3/uL (ref 150–400)
RBC: 2.84 MIL/uL — AB (ref 4.22–5.81)
RDW: 14 % (ref 11.5–15.5)
WBC: 8.1 10*3/uL (ref 4.0–10.5)

## 2014-02-22 LAB — GLUCOSE, CAPILLARY: Glucose-Capillary: 167 mg/dL — ABNORMAL HIGH (ref 70–99)

## 2014-02-22 MED ORDER — METOPROLOL SUCCINATE ER 25 MG PO TB24
25.0000 mg | ORAL_TABLET | Freq: Every day | ORAL | Status: DC
  Administered 2014-02-22 – 2014-02-23 (×2): 25 mg via ORAL
  Filled 2014-02-22 (×3): qty 1

## 2014-02-22 MED ORDER — ALBUTEROL SULFATE (2.5 MG/3ML) 0.083% IN NEBU: 3.0000 mL | INHALATION_SOLUTION | Freq: Four times a day (QID) | RESPIRATORY_TRACT | Status: DC | PRN

## 2014-02-22 MED ORDER — HYDROMORPHONE HCL 1 MG/ML IJ SOLN
0.2500 mg | INTRAMUSCULAR | Status: DC | PRN
  Administered 2014-02-22 (×3): 0.5 mg via INTRAVENOUS

## 2014-02-22 MED ORDER — DIPHENHYDRAMINE HCL 12.5 MG/5ML PO ELIX
12.5000 mg | ORAL_SOLUTION | ORAL | Status: DC | PRN
  Administered 2014-02-22 (×3): 25 mg via ORAL
  Filled 2014-02-22 (×3): qty 10

## 2014-02-22 MED ORDER — METOCLOPRAMIDE HCL 5 MG/ML IJ SOLN: 5.0000 mg | Freq: Three times a day (TID) | INTRAMUSCULAR | Status: DC | PRN

## 2014-02-22 MED ORDER — FLUTICASONE PROPIONATE HFA 44 MCG/ACT IN AERO
2.0000 | INHALATION_SPRAY | Freq: Two times a day (BID) | RESPIRATORY_TRACT | Status: DC
  Administered 2014-02-22 – 2014-02-23 (×4): 2 via RESPIRATORY_TRACT
  Filled 2014-02-22: qty 10.6

## 2014-02-22 MED ORDER — LOSARTAN POTASSIUM 50 MG PO TABS
50.0000 mg | ORAL_TABLET | Freq: Every day | ORAL | Status: DC
  Administered 2014-02-22 – 2014-02-23 (×2): 50 mg via ORAL
  Filled 2014-02-22 (×3): qty 1

## 2014-02-22 MED ORDER — ONDANSETRON 4 MG PO TBDP
4.0000 mg | ORAL_TABLET | Freq: Three times a day (TID) | ORAL | Status: DC | PRN
  Filled 2014-02-22: qty 1

## 2014-02-22 MED ORDER — ALLOPURINOL 100 MG PO TABS
100.0000 mg | ORAL_TABLET | Freq: Every day | ORAL | Status: DC
  Administered 2014-02-22 – 2014-02-23 (×2): 100 mg via ORAL
  Filled 2014-02-22 (×3): qty 1

## 2014-02-22 MED ORDER — ACETAMINOPHEN 325 MG PO TABS
650.0000 mg | ORAL_TABLET | Freq: Four times a day (QID) | ORAL | Status: DC | PRN
  Administered 2014-02-22 – 2014-02-23 (×2): 650 mg via ORAL
  Filled 2014-02-22 (×2): qty 2

## 2014-02-22 MED ORDER — BISACODYL 10 MG RE SUPP: 10.0000 mg | Freq: Every day | RECTAL | Status: DC | PRN

## 2014-02-22 MED ORDER — OXYCODONE HCL 5 MG PO TABS
5.0000 mg | ORAL_TABLET | ORAL | Status: DC | PRN
  Administered 2014-02-22 – 2014-02-23 (×10): 10 mg via ORAL
  Administered 2014-02-23: 5 mg via ORAL
  Administered 2014-02-23 – 2014-02-24 (×2): 10 mg via ORAL
  Filled 2014-02-22 (×13): qty 2

## 2014-02-22 MED ORDER — HYDROMORPHONE HCL 1 MG/ML IJ SOLN
INTRAMUSCULAR | Status: AC
  Filled 2014-02-22: qty 1

## 2014-02-22 MED ORDER — ONDANSETRON HCL 4 MG PO TABS: 4.0000 mg | ORAL_TABLET | Freq: Four times a day (QID) | ORAL | Status: DC | PRN

## 2014-02-22 MED ORDER — DOCUSATE SODIUM 100 MG PO CAPS
100.0000 mg | ORAL_CAPSULE | Freq: Two times a day (BID) | ORAL | Status: DC
  Administered 2014-02-22 – 2014-02-23 (×5): 100 mg via ORAL
  Filled 2014-02-22 (×8): qty 1

## 2014-02-22 MED ORDER — ONDANSETRON HCL 4 MG/2ML IJ SOLN: 4.0000 mg | Freq: Once | INTRAMUSCULAR | Status: DC | PRN

## 2014-02-22 MED ORDER — METHOCARBAMOL 500 MG PO TABS
500.0000 mg | ORAL_TABLET | Freq: Four times a day (QID) | ORAL | Status: DC | PRN
  Administered 2014-02-22 – 2014-02-23 (×4): 500 mg via ORAL
  Filled 2014-02-22 (×6): qty 1

## 2014-02-22 MED ORDER — HYDROMORPHONE HCL 1 MG/ML IJ SOLN
0.5000 mg | INTRAMUSCULAR | Status: DC | PRN
  Administered 2014-02-22 – 2014-02-23 (×5): 1 mg via INTRAVENOUS
  Filled 2014-02-22 (×5): qty 1

## 2014-02-22 MED ORDER — METOPROLOL TARTRATE 25 MG PO TABS
25.0000 mg | ORAL_TABLET | Freq: Two times a day (BID) | ORAL | Status: DC | PRN
  Administered 2014-02-23: 25 mg via ORAL
  Filled 2014-02-22 (×2): qty 1

## 2014-02-22 MED ORDER — BUPROPION HCL ER (XL) 300 MG PO TB24
300.0000 mg | ORAL_TABLET | Freq: Every day | ORAL | Status: DC
  Filled 2014-02-22 (×3): qty 1

## 2014-02-22 MED ORDER — TRAZODONE HCL 50 MG PO TABS
50.0000 mg | ORAL_TABLET | Freq: Two times a day (BID) | ORAL | Status: DC
  Administered 2014-02-22 – 2014-02-23 (×3): 50 mg via ORAL
  Filled 2014-02-22 (×7): qty 1

## 2014-02-22 MED ORDER — GABAPENTIN 100 MG PO CAPS
100.0000 mg | ORAL_CAPSULE | Freq: Three times a day (TID) | ORAL | Status: DC
  Administered 2014-02-22 – 2014-02-23 (×5): 100 mg via ORAL
  Filled 2014-02-22 (×9): qty 1

## 2014-02-22 MED ORDER — ONDANSETRON HCL 4 MG/2ML IJ SOLN
4.0000 mg | Freq: Four times a day (QID) | INTRAMUSCULAR | Status: DC | PRN
Start: 2014-02-22 — End: 2014-02-24

## 2014-02-22 MED ORDER — ONDANSETRON HCL 4 MG/2ML IJ SOLN
INTRAMUSCULAR | Status: DC | PRN
  Administered 2014-02-22: 4 mg via INTRAVENOUS

## 2014-02-22 MED ORDER — COLCHICINE 0.6 MG PO TABS
0.6000 mg | ORAL_TABLET | Freq: Every day | ORAL | Status: DC
  Administered 2014-02-22 – 2014-02-23 (×2): 0.6 mg via ORAL
  Filled 2014-02-22 (×3): qty 1

## 2014-02-22 MED ORDER — HYDROMORPHONE HCL 1 MG/ML IJ SOLN
0.2500 mg | INTRAMUSCULAR | Status: DC | PRN
  Administered 2014-02-22 (×4): 0.5 mg via INTRAVENOUS

## 2014-02-22 MED ORDER — ALUM & MAG HYDROXIDE-SIMETH 200-200-20 MG/5ML PO SUSP
30.0000 mL | ORAL | Status: DC | PRN
Start: 2014-02-22 — End: 2014-02-24

## 2014-02-22 MED ORDER — METOCLOPRAMIDE HCL 10 MG PO TABS
5.0000 mg | ORAL_TABLET | Freq: Three times a day (TID) | ORAL | Status: DC | PRN
Start: 2014-02-22 — End: 2014-02-24

## 2014-02-22 MED ORDER — PHENOL 1.4 % MT LIQD: 1.0000 | OROMUCOSAL | Status: DC | PRN

## 2014-02-22 MED ORDER — MOMETASONE FURO-FORMOTEROL FUM 100-5 MCG/ACT IN AERO
2.0000 | INHALATION_SPRAY | Freq: Two times a day (BID) | RESPIRATORY_TRACT | Status: DC
  Administered 2014-02-22 – 2014-02-23 (×4): 2 via RESPIRATORY_TRACT
  Filled 2014-02-22: qty 8.8

## 2014-02-22 MED ORDER — MENTHOL 3 MG MT LOZG: 1.0000 | LOZENGE | OROMUCOSAL | Status: DC | PRN

## 2014-02-22 MED ORDER — METHOCARBAMOL 1000 MG/10ML IJ SOLN
500.0000 mg | Freq: Four times a day (QID) | INTRAVENOUS | Status: DC | PRN
  Administered 2014-02-22: 500 mg via INTRAVENOUS
  Filled 2014-02-22 (×2): qty 5

## 2014-02-22 MED ORDER — SENNA 8.6 MG PO TABS
1.0000 | ORAL_TABLET | Freq: Two times a day (BID) | ORAL | Status: DC
  Administered 2014-02-22 – 2014-02-23 (×5): 8.6 mg via ORAL
  Filled 2014-02-22 (×7): qty 1

## 2014-02-22 MED ORDER — PANTOPRAZOLE SODIUM 40 MG PO TBEC
80.0000 mg | DELAYED_RELEASE_TABLET | Freq: Every day | ORAL | Status: DC
  Administered 2014-02-22 – 2014-02-23 (×2): 80 mg via ORAL
  Filled 2014-02-22 (×3): qty 2

## 2014-02-22 MED ORDER — AMITRIPTYLINE HCL 10 MG PO TABS
10.0000 mg | ORAL_TABLET | Freq: Every day | ORAL | Status: DC
  Administered 2014-02-22 – 2014-02-23 (×2): 10 mg via ORAL
  Filled 2014-02-22 (×3): qty 1

## 2014-02-22 MED ORDER — ASPIRIN 81 MG PO CHEW
81.0000 mg | CHEWABLE_TABLET | Freq: Every day | ORAL | Status: DC
  Administered 2014-02-22 – 2014-02-23 (×2): 81 mg via ORAL
  Filled 2014-02-22 (×3): qty 1

## 2014-02-22 MED ORDER — ACETAMINOPHEN 650 MG RE SUPP: 650.0000 mg | Freq: Four times a day (QID) | RECTAL | Status: DC | PRN

## 2014-02-22 MED ORDER — CEFAZOLIN SODIUM-DEXTROSE 2-3 GM-% IV SOLR
2.0000 g | Freq: Four times a day (QID) | INTRAVENOUS | Status: AC
  Administered 2014-02-22 (×3): 2 g via INTRAVENOUS
  Filled 2014-02-22 (×3): qty 50

## 2014-02-22 MED ORDER — OXYCODONE-ACETAMINOPHEN 5-325 MG PO TABS
1.0000 | ORAL_TABLET | ORAL | Status: DC | PRN
  Administered 2014-02-24: 2 via ORAL
  Filled 2014-02-22 (×2): qty 2

## 2014-02-22 MED ORDER — GUAIFENESIN-CODEINE 100-10 MG/5ML PO SOLN: 10.0000 mL | Freq: Four times a day (QID) | ORAL | Status: DC | PRN

## 2014-02-22 MED ORDER — MAGNESIUM CITRATE PO SOLN: 1.0000 | Freq: Once | ORAL | Status: AC | PRN

## 2014-02-22 MED ORDER — POLYETHYLENE GLYCOL 3350 17 G PO PACK: 17.0000 g | PACK | Freq: Every day | ORAL | Status: DC | PRN

## 2014-02-22 MED ORDER — ACETIC ACID 2 % OT SOLN: 4.0000 [drp] | Freq: Three times a day (TID) | OTIC | Status: DC

## 2014-02-22 MED ORDER — GUAIFENESIN-CODEINE 100-10 MG/5ML PO SYRP
10.0000 mL | ORAL_SOLUTION | Freq: Four times a day (QID) | ORAL | Status: DC | PRN
  Filled 2014-02-22: qty 10

## 2014-02-22 MED ORDER — POTASSIUM CHLORIDE IN NACL 20-0.45 MEQ/L-% IV SOLN
INTRAVENOUS | Status: DC
  Administered 2014-02-22: 04:00:00 via INTRAVENOUS
  Filled 2014-02-22 (×6): qty 1000

## 2014-02-22 NOTE — Plan of Care (Signed)
Problem: Acute Rehab PT Goals(only PT should resolve) Goal: Pt Will Transfer Bed To Chair/Chair To Bed On LRAD

## 2014-02-22 NOTE — Transfer of Care (Signed)
Immediate Anesthesia Transfer of Care Note  Patient: Wesley Beard  Procedure(s) Performed: Procedure(s): OPEN REDUCTION INTERNAL FIXATION (ORIF) RIGHT PROXIMAL HUMERUS FRACTURE AND SHAFT (Right)  Patient Location: PACU  Anesthesia Type:GA combined with regional for post-op pain  Level of Consciousness: awake, sedated and patient cooperative  Airway & Oxygen Therapy: Patient connected to nasal cannula oxygen  Post-op Assessment: Report given to PACU RN and Post -op Vital signs reviewed and stable  Post vital signs: Reviewed and stable  Complications: No apparent anesthesia complications

## 2014-02-22 NOTE — Evaluation (Signed)
Occupational Therapy Evaluation Patient Details Name: Wesley Beard MRN: [id number] DOB: 30-Jun-1950 Today's Date: 02/22/2014    History of Present Illness 64 yo male who fell at work on cement stairs [place] [employer] sustained a fracture of  R prox humerus with ORIF completed 02/21/14.   Clinical Impression   Pt admitted with the above diagnoses and presents with below problem list. Pt will benefit from continued acute  OT to address the below listed deficits and maximize independence with basic ADLs prior to d/c to next venue. PTA pt was independent with ADLs. Pt currently and mod A level for most ADLs and transfers. ADL and sling education provided. Exercises completed as detailed below. Pt lives alone. Recommend SNF for further rehab prior to returning home.      Follow Up Recommendations  SNF    Equipment Recommendations  Other (comment) (defer to next venue)    Recommendations for Other Services       Precautions / Restrictions Precautions Precautions: Shoulder Type of Shoulder Precautions: no shoulder movement; NWB Shoulder Interventions: Shoulder sling/immobilizer;Off for dressing/bathing/exercises Precaution Booklet Issued: Yes (comment) Precaution Comments: reviewed with pt and son Required Braces or Orthoses: Sling Restrictions Weight Bearing Restrictions: Yes RUE Weight Bearing: Non weight bearing Other Position/Activity Restrictions: until further clarification obtained      Mobility Bed Mobility Overal bed mobility: Needs Assistance Bed Mobility: Supine to Sit   Sidelying to sit: Max assist;Mod assist Supine to sit: Mod assist;Max assist;HOB elevated     General bed mobility comments: assistance to power up and scoot forward  Transfers Overall transfer level: Needs assistance Equipment used: Quad cane;1 person hand held assist Transfers: Sit to/from Stand Sit to Stand: Min assist Stand pivot transfers: Min assist       General transfer comment: cues  for technique    Balance Overall balance assessment: Needs assistance Sitting-balance support: Single extremity supported;Feet supported Sitting balance-Leahy Scale: Fair Sitting balance - Comments: hindered by edema and awkward presentation of RUE Postural control: Posterior lean Standing balance support: Single extremity supported;During functional activity Standing balance-Leahy Scale: Poor Standing balance comment: cane for balance                            ADL Overall ADL's : Needs assistance/impaired Eating/Feeding: Set up;Sitting   Grooming: Set up;Sitting   Upper Body Bathing: Minimal assitance;Sitting   Lower Body Bathing: Moderate assistance;Sit to/from stand   Upper Body Dressing : Minimal assistance;Sitting   Lower Body Dressing: Moderate assistance;Sit to/from stand   Toilet Transfer: Ambulation;Min guard;RW (3n1 over toilet)   Toileting- Clothing Manipulation and Hygiene: Sit to/from stand;Minimal assistance   Tub/ Shower Transfer: Minimal assistance;Ambulation;3 in 1   Functional mobility during ADLs: Min guard;Cane;Minimal assistance General ADL Comments: ADL and sling education provided to pt and son.      Vision                     Perception     Praxis      Pertinent Vitals/Pain Pain Assessment: 0-10 Pain Score: 7  Faces Pain Scale: Hurts even more Pain Location: R shoulder Pain Descriptors / Indicators: Aching Pain Intervention(s): Limited activity within patient's tolerance;Monitored during session;Premedicated before session;Repositioned     Hand Dominance Right   Extremity/Trunk Assessment Upper Extremity Assessment Upper Extremity Assessment: RUE deficits/detail RUE Deficits / Details: R arm in sling s/p ORIF R proximal humerus   Lower Extremity Assessment Lower Extremity Assessment: Defer  to PT evaluation LLE Deficits / Details: mild residual stroke weakness from years ago   Cervical / Trunk  Assessment Cervical / Trunk Assessment: Normal   Communication Communication Communication: No difficulties   Cognition Arousal/Alertness: Awake/alert Behavior During Therapy: WFL for tasks assessed/performed Overall Cognitive Status: Within Functional Limits for tasks assessed                     General Comments       Exercises Exercises: Shoulder     Shoulder Instructions Shoulder Instructions ROM for elbow, wrist and digits of operated UE: Min-guard    Home Living Family/patient expects to be discharged to:: Skilled nursing facility Living Arrangements: Alone Available Help at Discharge: Family;Available PRN/intermittently Type of Home: House Home Access: Stairs to enter [employer] of Steps: 2                              Prior Functioning/Environment Level of Independence: Independent        Comments: works at the [employer]; volunteers for [employer]    OT Diagnosis: Acute pain   OT Problem List: Impaired balance (sitting and/or standing);Decreased knowledge of use of DME or AE;Decreased knowledge of precautions;Impaired UE functional use;Pain;Obesity   OT Treatment/Interventions: Self-care/ADL training;Therapeutic exercise;DME and/or AE instruction;Therapeutic activities;Patient/family education;Balance training    OT Goals(Current goals can be found in the care plan section) Acute Rehab OT Goals Patient Stated Goal: to be safe at home OT Goal Formulation: With patient/family Time For Goal Achievement: 03/01/14 Potential to Achieve Goals: Good ADL Goals Pt Will Perform Upper Body Dressing: with supervision;sitting Pt/caregiver will Perform Home Exercise Program: Right Upper extremity;With Supervision;With written HEP provided Additional ADL Goal #1: Pt will perfrom bed mobility adhering to shoulder precautions at min guard level to prepare for OOB ADLs.  OT Frequency: Min 3X/week   Barriers to D/C: Decreased caregiver  support  lives alone       Co-evaluation              End of Session Equipment Utilized During Treatment: Gait belt;Rolling walker;Oxygen;Other (comment) (sling)  Activity Tolerance: Patient tolerated treatment well;Patient limited by pain Patient left: in chair;with call bell/phone within reach;with family/visitor present   Time: 1353-1435 OT Time Calculation (min): 42 min Charges:  OT General Charges $OT Visit: 1 Procedure OT Evaluation $Initial OT Evaluation Tier I: 1 Procedure OT Treatments $Self Care/Home Management : 8-22 mins $Therapeutic Exercise: 8-22 mins G-Codes:    Hortencia Pilar 23-Mar-2014, 2:56 PM

## 2014-02-22 NOTE — Op Note (Signed)
02/21/2014  12:00 AM  PATIENT:  Wesley Beard    PRE-OPERATIVE DIAGNOSIS:  Right proximal humerus and shaft fracture  POST-OPERATIVE DIAGNOSIS:  Same  PROCEDURE:  OPEN REDUCTION INTERNAL FIXATION (ORIF) RIGHT PROXIMAL HUMERUS FRACTURE AND SHAFT   SURGEON:  Johnny Bridge, MD  PHYSICIAN ASSISTANT: Joya Gaskins, OPA-C, present and scrubbed throughout the case, critical for completion in a timely fashion, and for retraction, instrumentation, and closure.  ANESTHESIA:   General  PREOPERATIVE INDICATIONS:  Wesley Beard is a  64 y.o. male with a diagnosis of RIGHT PROXIMAL HUMUS FRACTURE AND SHAFT FRACTURE who elected for surgical management as the fracture had significant displacement, and comminution..    The risks benefits and alternatives were discussed with the patient including but not limited to the risks of nonoperative treatment, versus surgical intervention including infection, bleeding, nerve injury, malunion, nonunion, the need for revision surgery, hardware prominence, hardware failure, the need for hardware removal, blood clots, cardiopulmonary complications, conversion to arthroplasty, morbidity, mortality, among others, and they were willing to proceed.  Predicted outcome is good, although there will be at least a six to nine month expected recovery.    OPERATIVE IMPLANTS: Biomet S3 locking plate, long, with 4 distal bicortical screws in the shaft, with 2 interfragmentary screws through butterfly segments, with 6 proximal locking pegs in the head.  OPERATIVE FINDINGS: Displaced proximal humerus fracture that was segmental in nature with multiple comminuted pieces of bone. The head was intact. He had a huge hematoma as well.  OPERATIVE PROCEDURE: The patient was brought to the operating room and placed in the supine position. General anesthesia was administered. IV antibiotics were given. He was placed in the beach chair position. All bony prominences were padded. The  upper extremity was prepped and draped in usual sterile fashion. Deltopectoral incision was performed.  I exposed the fracture site, and placed deep retractors. I did not tenotomize the biceps tendon. This was left in place. I had to do to completely separate exposures, one for the deltopectoral approach, and then performed a brachialis splitting approach more distally while extending the incision down the arm. I cleaned the hematoma, which is fairly extensive, and then exposed the fracture fragments, the proximal third of the humerus was split into 2 major pieces, that were separate from the head. The posterior piece was fairly thin, and not amenable to fixation. Therefore this was effectively a bridge plate. The anterior segmental piece was still attached to the head piece by soft tissue, so I left this effectively in place. I reduced the shaft piece to the segmental piece as gently as possible, and held it provisionally with a clamp. I selected the appropriate length plate, and then placed the plate on the head, and placed K wires in the head, and then placed the plate distally on the shaft. I was satisfied with the position in the head using C-arm, I fixed the shaft distally in the center of the bone.  I then confirmed that I was satisfied with my bony apposition of the segmental piece to the main distal fragment, and then secured the plate proximally fixing the head with smooth pegs. These were locking.  Asst. segmental piece was slightly off the bone on the proximal third, and I placed the screws through the segmental piece, which actually restored the reduction of the segmental piece back into position underneath the plate. I placed 2 of these, and by the time I finished with the second screw in the piece had been  drawn to the plate, so I had to change the length of the screws. I did this sequentially in order to maintain reduction.  I secured the plate distally with a total of 8 cortices, 4  bicortical screws below the fracture site. I had excellent purchase on these screws. I now given the length of the buttress plate. I used C-arm to confirm that I was satisfied with the position of all of the screws both proximally and distally as well as the overall reduction of the arm. Excellent fixation was achieved. I irrigated the wounds copiously and then repaired the subcutaneous tissue with Vicryl. Steri-Strips and sterile gauze were applied. He was awakened and returned to the PACU in stable and satisfactory condition.    He was placed in a coaptation splint and a sling. He had a preoperative regional block as well. He tolerated the procedure well with no complications.

## 2014-02-22 NOTE — Progress Notes (Signed)
Patient ID: KIREN MCISAAC, male   DOB: 11-Jun-1950, 64 y.o.   MRN: [id number]     Subjective:  Patient reports pain as mild to moderate.  Patient alert and in bed in no acute distress.  He denies any CP or SOB  Objective:   VITALS:   Filed Vitals:   02/22/14 0200 02/22/14 0215 02/22/14 0240 02/22/14 0506  BP: 109/66 124/81 114/56 104/62  Pulse: 98 99 102 93  Temp:  98.2 F (36.8 C) 98.6 F (37 C) 98.6 F (37 C)  TempSrc:   Oral Oral  Resp: 13 19 17 18   Height:      SpO2: 96% 96% 97% 98%    ABD soft Sensation intact distally Dorsiflexion/Plantar flexion intact Incision: dressing C/D/I and no drainage Patient in co-app shoulder splint Sling in place Good wrist, hand and finger motion  Lab Results  Component Value Date   WBC 8.1 02/22/2014   HGB 8.9* 02/22/2014   HCT 27.3* 02/22/2014   MCV 96.1 02/22/2014   PLT 191 02/22/2014   BMET    Component Value Date/Time   NA 139 02/22/2014 0406   NA 141 06/24/2012 1028   K 3.9 02/22/2014 0406   K 4.4 06/24/2012 1028   CL 97 02/22/2014 0406   CL 106 06/24/2012 1028   CO2 30 02/22/2014 0406   CO2 27 06/24/2012 1028   GLUCOSE 130* 02/22/2014 0406   GLUCOSE 105* 06/24/2012 1028   BUN 11 02/22/2014 0406   BUN 21.5 06/24/2012 1028   CREATININE 0.85 02/22/2014 0406   CREATININE 1.2 06/24/2012 1028   CALCIUM 8.2* 02/22/2014 0406   CALCIUM 9.0 06/24/2012 1028   GFRNONAA 89* 02/22/2014 0406   GFRAA >90 02/22/2014 0406     Assessment/Plan: 1 Day Post-Op   Principal Problem:   Fracture of humerus, proximal, right, closed Active Problems:   Shoulder fracture   Advance diet Up with therapy  ABLA continue to monitor may need two units today Continue splint and sling Dry dressing PRN    DOUGLAS PARRY, BRANDON 02/22/2014, 7:24 AM  Discussed and agree with above.  Marchia Bond, MD Cell [phone]

## 2014-02-22 NOTE — Evaluation (Signed)
Physical Therapy Evaluation Patient Details Name: Wesley Beard MRN: [id number] DOB: [birth year] Today's Date: 02/22/2014   History of Present Illness  64 yo male who fell at work on cement stairs [place] [employer] sustained a fracture of  R prox humerus with ORIF completed 02/21/14.  Clinical Impression  Pt was seen for evaluation of mobility and noted his limited mobility after the surgery on RUE.  He is home alone in the daytime even if he goes to stay with son, therefore cannot safely navigate without inpt treatment first.  Recommend SNF and pt is willing to agree.    Follow Up Recommendations SNF;Supervision/Assistance - 24 hour    Equipment Recommendations  Other (comment) (hemiwalker)    Recommendations for Other Services       Precautions / Restrictions Precautions Required Braces or Orthoses: Sling (at all  times) Restrictions Weight Bearing Restrictions: No Other Position/Activity Restrictions: until further clarification obtained      Mobility  Bed Mobility Overal bed mobility: Needs Assistance Bed Mobility: Sidelying to Sit;Supine to Sit   Sidelying to sit: Max assist;Mod assist Supine to sit: Mod assist;Max assist     General bed mobility comments: Pt came to L side due to his RUE sling and fracture and needed assistance to clear his trunk off bed and to assist scootingout on R hip  Transfers Overall transfer level: Needs assistance Equipment used: Quad cane;1 person hand held assist Transfers: Sit to/from [facility] Sit to Stand: Mod assist Stand pivot transfers: Min assist       General transfer comment: reminders for sequence but pt is quite capable of maneuvering with a little assistance  Ambulation/Gait Ambulation/Gait assistance: Min assist Ambulation Distance (Feet): 30 Feet Assistive device: Quad cane;1 person hand held assist Gait Pattern/deviations: Step-through pattern;Decreased step length - right;Decreased step length -  left;Decreased stride length;Decreased weight shift to left;Wide base of support;Drifts right/left Gait velocity: reduced Gait velocity interpretation: Below normal speed for age/gender    Stairs            Wheelchair Mobility    Modified Rankin (Stroke Patients Only)       Balance Overall balance assessment: Needs assistance Sitting-balance support: Feet supported;Single extremity supported Sitting balance-Leahy Scale: Fair Sitting balance - Comments: hindered by edema and awkward presentation of RUE Postural control: Posterior lean Standing balance support: Single extremity supported (with gait belt) Standing balance-Leahy Scale: Poor Standing balance comment: needs contact to steady but surgery was overnight                             Pertinent Vitals/Pain Pain Assessment: Faces Faces Pain Scale: Hurts even more Pain Location: R shoulder Pain Intervention(s): Limited activity within patient's tolerance;Monitored during session;Premedicated before session;Repositioned;Ice applied    Home Living Family/patient expects to be discharged to:: Private residence Living Arrangements: Alone Available Help at Discharge: Family Type of Home: House Home Access:  (son's information of his house unavailable)              Prior Function Level of Independence: Independent               Hand Dominance   Dominant Hand: Right    Extremity/Trunk Assessment   Upper Extremity Assessment: RUE deficits/detail RUE Deficits / Details: R arm in sling s/p ORIF R proximal humerus         Lower Extremity Assessment: LLE deficits/detail   LLE Deficits / Details: mild residual stroke weakness  from years ago  Cervical / Trunk Assessment: Normal  Communication   Communication: No difficulties  Cognition Arousal/Alertness: Awake/alert Behavior During Therapy: WFL for tasks assessed/performed Overall Cognitive Status: Within Functional Limits for tasks  assessed                      General Comments General comments (skin integrity, edema, etc.): Pt has extensive bruising and edema R shoulder but is very capable of moving once up with quad cane and steadying help    Exercises        Assessment/Plan    PT Assessment Patient needs continued PT services  PT Diagnosis Difficulty walking   PT Problem List Decreased strength;Decreased range of motion;Decreased activity tolerance;Decreased balance;Decreased mobility;Decreased coordination;Decreased knowledge of use of DME;Decreased skin integrity;Pain;Obesity  PT Treatment Interventions DME instruction;Gait training;Stair training;Functional mobility training;Therapeutic activities;Therapeutic exercise;Balance training;Neuromuscular re-education;Patient/family education   PT Goals (Current goals can be found in the Care Plan section) Acute Rehab PT Goals Patient Stated Goal: to be safe at home PT Goal Formulation: With patient Time For Goal Achievement: 03/08/14 Potential to Achieve Goals: Good    Frequency Min 5X/week   Barriers to discharge Decreased caregiver support (son is working) Pt will need help to maneuver as RUE is dominant hand and cannot balance alone    Co-evaluation               End of Session Equipment Utilized During Treatment: Gait belt;Other (comment);Oxygen (sling, quad cane) Activity Tolerance: Patient tolerated treatment well;Patient limited by fatigue Patient left: in chair;with call bell/phone within reach;Other (comment) (advised to get help to use urinal ) Nurse Communication: Mobility status    Functional Assessment Tool Used: clinical judgment Functional Limitation: Mobility: Walking and moving around Mobility: Walking and Moving Around Current Status (E7517): At least 40 percent but less than 60 percent impaired, limited or restricted Mobility: Walking and Moving Around Goal Status [phone]): At least 1 percent but less than 20 percent  impaired, limited or restricted    Time: 1145-1212 PT Time Calculation (min) (ACUTE ONLY): 27 min   Charges:   PT Evaluation $Initial PT Evaluation Tier I: 1 Procedure PT Treatments $Gait Training: 8-22 mins   PT G Codes:   PT G-Codes **NOT FOR INPATIENT CLASS** Functional Assessment Tool Used: clinical judgment Functional Limitation: Mobility: Walking and moving around Mobility: Walking and Moving Around Current Status (B4496): At least 40 percent but less than 60 percent impaired, limited or restricted Mobility: Walking and Moving Around Goal Status [phone]): At least 1 percent but less than 20 percent impaired, limited or restricted    Ramond Dial 02/22/2014, 1:11 PM   Mee Hives, PT MS Acute Rehab Dept. Number: [phone]

## 2014-02-23 ENCOUNTER — Encounter: Payer: Self-pay | Admitting: Adult Health

## 2014-02-23 MED ORDER — OXYCODONE-ACETAMINOPHEN 10-325 MG PO TABS: 1.0000 | ORAL_TABLET | Freq: Four times a day (QID) | ORAL | Status: DC | PRN

## 2014-02-23 MED ORDER — DIAZEPAM 5 MG PO TABS: 5.0000 mg | ORAL_TABLET | Freq: Every evening | ORAL | Status: DC | PRN

## 2014-02-23 MED ORDER — HYDROMORPHONE HCL 2 MG PO TABS: 2.0000 mg | ORAL_TABLET | ORAL | Status: DC | PRN

## 2014-02-23 MED ORDER — ONDANSETRON HCL 4 MG PO TABS: 4.0000 mg | ORAL_TABLET | Freq: Three times a day (TID) | ORAL | Status: DC | PRN

## 2014-02-23 MED ORDER — SENNA-DOCUSATE SODIUM 8.6-50 MG PO TABS: 2.0000 | ORAL_TABLET | Freq: Every day | ORAL | Status: DC

## 2014-02-23 NOTE — Progress Notes (Signed)
Physical Therapy Treatment Patient Details Name: Wesley Beard MRN: [id number] DOB: 01-09-1951 Today's Date: 02/23/2014    History of Present Illness 64 yo male who fell at work on cement stairs at [employer] sustained a fracture of  R prox humerus with ORIF completed 02/21/14.    PT Comments    Pt was seen for continued work on his gait, attempting stairs with son present and then practiced BR transfers.  Has a great effort today and demonstrating good improvement to recommend further treatment.  Pt is going to SNF for rehab due to his inability to have family assistance during the day.  Follow Up Recommendations  SNF;Supervision/Assistance - 24 hour     Equipment Recommendations  Other (comment) (hemiwalker)    Recommendations for Other Services       Precautions / Restrictions Precautions Precautions: Shoulder Type of Shoulder Precautions: no shoulder movement; NWB Required Braces or Orthoses: Sling Restrictions Weight Bearing Restrictions: Yes RUE Weight Bearing: Non weight bearing Other Position/Activity Restrictions: until further clarification obtained    Mobility  Bed Mobility Overal bed mobility: Needs Assistance Bed Mobility: Supine to Sit   Sidelying to sit: Min assist;Mod assist Supine to sit: HOB elevated;Mod assist (assisted under trunk)     General bed mobility comments: assisted powering up from bed but pt is very aware of how to assist, besides his effort to move R hand with L hand by the fingers.  Discussed this as a contraindication  Transfers Overall transfer level: Needs assistance Equipment used: Quad cane;1 person hand held assist Transfers: Sit to/from [employer] to Stand: Min assist Stand pivot transfers: Min assist       General transfer comment: cues for technique in BR with son present who will be his caregiver when he finishes with SNF  Ambulation/Gait Ambulation/Gait assistance: Min assist;Min guard Ambulation  Distance (Feet): 160 Feet Assistive device: Quad cane;1 person hand held assist Gait Pattern/deviations: Step-through pattern;Decreased stride length;Wide base of support;Drifts right/left Gait velocity: reduced Gait velocity interpretation: Below normal speed for age/gender     Stairs Stairs: Yes Stairs assistance: Min guard Stair Management: One rail Left;Forwards;Step to pattern Number of Stairs: 10 General stair comments: good effort but needs extra time as he felt woozy going down stairs  Wheelchair Mobility    Modified Rankin (Stroke Patients Only)       Balance Overall balance assessment: Needs assistance Sitting-balance support: Feet supported Sitting balance-Leahy Scale: Good Sitting balance - Comments: more controlled sitting balance after having sling adjusted to control and support RUE better Postural control: Posterior lean Standing balance support: Single extremity supported Standing balance-Leahy Scale: Fair Standing balance comment: dynamic standing fair-                    Cognition Arousal/Alertness: Awake/alert Behavior During Therapy: WFL for tasks assessed/performed Overall Cognitive Status: Within Functional Limits for tasks assessed                      Exercises      General Comments General comments (skin integrity, edema, etc.): Pt has a better presentation of R arm but does have hand edema.  Pt has been advised by MD to use ice on entire RUE and asked him to gently actively open and close hand.      Pertinent Vitals/Pain Pain Assessment: No/denies pain Faces Pain Scale: Hurts little more Pain Location: R shoulder Pain Intervention(s): Limited activity within patient's tolerance;Premedicated before session;Monitored during session;Repositioned;Other (comment) (  asked pt not to pull R hand fingers wtih L hand to move)    Home Living                      Prior Function            PT Goals (current goals can now  be found in the care plan section) Acute Rehab PT Goals Patient Stated Goal: to be safe at home Progress towards PT goals: Progressing toward goals    Frequency  Min 5X/week    PT Plan Current plan remains appropriate    Co-evaluation             End of Session Equipment Utilized During Treatment: Gait belt;Other (comment);Oxygen (quad cane) Activity Tolerance: Patient tolerated treatment well;Patient limited by fatigue Patient left: in chair;with call bell/phone within reach;Other (comment)     Time: 5597-4163 PT Time Calculation (min) (ACUTE ONLY): 30 min  Charges:  $Gait Training: 8-22 mins $Therapeutic Activity: 8-22 mins                    G Codes:      Ramond Dial 02-28-14, 2:51 PM   Mee Hives, PT MS Acute Rehab Dept. Number: [phone]

## 2014-02-23 NOTE — Clinical Social Work Psychosocial (Signed)
Clinical Social Work Department BRIEF PSYCHOSOCIAL ASSESSMENT 02/23/2014  Patient:  Wesley Beard, Wesley Beard     Account Number:  [id number]     Admit date:  02/21/2014  Clinical Social Worker:  Wylene Men  Date/Time:  02/22/2014 12:31 PM  Referred by:  Physician  Date Referred:  02/22/2014 Referred for  Psychosocial assessment  SNF Placement   Other Referral:   none   Interview type:  Patient Other interview type:   spoke with patient.  Patient requested son be present at time of assessment.    PSYCHOSOCIAL DATA Living Status:  ALONE Admitted from facility:   Level of care:   Primary support name:  Roselyn Reef Primary support relationship to patient:  CHILD, ADULT Degree of support available:   strong    CURRENT CONCERNS Current Concerns  Post-Acute Placement   Other Concerns:   none    SOCIAL WORK ASSESSMENT / PLAN CSW assessed patient at bedside.  patient is alert and oriented.  Patient requested son Roselyn Reef to be involved in plan of care.  Patient's payor source is worker's comp and his care coordinator is Jenny Reichmann who can be reached at [phone].  Patient and son request to have patient placed near the [place] side of [place] for proximity convenience.  Care Coordinator aware and agreeable.  [facility] offered a bed to patient.  patient and son are agreeable to [facility] for [place].  [facility] liaison to complete paperwork at bedside prior to transportation. Care Coordinator for worker's comp has approved [facility] transportation services for patient upon dc.  Patient is aware and agreeable.  Patient is hopeful for independent level of care once completed STR.   Assessment/plan status:  Psychosocial Support/Ongoing Assessment of Needs Other assessment/ plan:   FL2  PASARR   Information/referral to community resources:   SNF    PATIENT'S/FAMILY'S RESPONSE TO PLAN OF CARE: Patient is agreeable to SNF placement and [facility].  Patient first  choice is [facility].       Nonnie Done, [place] [phone]  Psychiatric & Orthopedics (5N 1-16) Clinical Social Worker

## 2014-02-23 NOTE — Discharge Summary (Signed)
Physician Discharge Summary  Patient ID: Wesley Beard MRN: [id number] DOB/AGE: [birth year] 64 y.o.  Admit date: 02/21/2014 Discharge date: 02/23/2014  Admission Diagnoses:  Fracture of humerus, proximal, right, closed  Discharge Diagnoses:  Principal Problem:   Fracture of humerus, proximal, right, closed Active Problems:   Shoulder fracture   Past Medical History  Diagnosis Date  . MI, old 2010  . Heart palpitations     Dr. Otho Perl  . Depression   . Hypertension   . Stroke 2005    some left side weakness, TIA'a after CVA - last 10/2013  . Sleep apnea     does not  use cpap  . GERD (gastroesophageal reflux disease)   . Arthritis   . Cancer     basal cell skin cancer  . Atrial fibrillation   . Retinal tear of right eye   . Fracture of humerus, proximal, right, closed 02/21/2014    Surgeries: Procedure(s): OPEN REDUCTION INTERNAL FIXATION (ORIF) RIGHT PROXIMAL HUMERUS FRACTURE AND SHAFT on 02/21/2014 - 02/22/2014   Consultants (if any):    Discharged Condition: Improved  Hospital Course: Wesley Beard is an 64 y.o. male who was admitted 02/21/2014 with a diagnosis of Fracture of humerus, proximal, right, closed and went to the operating room on 02/21/2014 - 02/22/2014 and underwent the above named procedures.    He was given perioperative antibiotics:  Anti-infectives    Start     Dose/Rate Route Frequency Ordered Stop   02/22/14 0600  ceFAZolin (ANCEF) IVPB 2 g/50 mL premix     2 g100 mL/hr over 30 Minutes Intravenous On call to O.R. 02/21/14 1716 02/21/14 2029   02/22/14 0400  ceFAZolin (ANCEF) IVPB 2 g/50 mL premix     2 g100 mL/hr over 30 Minutes Intravenous Every 6 hours 02/22/14 0241 02/22/14 1813    .  He was given sequential compression devices, early ambulation, for DVT prophylaxis. The patient lives alone, and was not able to maintain his activities of daily living, and skilled nursing placement was recommended. He has coexisting risk factors including his  previous stroke.  He benefited maximally from the hospital stay and there were no complications.    Recent vital signs:  Filed Vitals:   02/23/14 0519  BP: 113/55  Pulse: 95  Temp: 98.8 F (37.1 C)  Resp: 19    Recent laboratory studies:  Lab Results  Component Value Date   HGB 8.9* 02/22/2014   HGB 13.7 02/17/2014   HGB 11.9* 12/04/2012   Lab Results  Component Value Date   WBC 8.1 02/22/2014   PLT 191 02/22/2014   Lab Results  Component Value Date   INR 0.96 02/17/2014   Lab Results  Component Value Date   NA 139 02/22/2014   K 3.9 02/22/2014   CL 97 02/22/2014   CO2 30 02/22/2014   BUN 11 02/22/2014   CREATININE 0.85 02/22/2014   GLUCOSE 130* 02/22/2014    Discharge Medications:     Medication List    TAKE these medications        acetic acid 2 % otic solution  Commonly known as:  VOSOL     albuterol 108 (90 BASE) MCG/ACT inhaler  Commonly known as:  PROVENTIL HFA;VENTOLIN HFA  Inhale 2 puffs into the lungs every 6 (six) hours as needed for wheezing.     allopurinol 100 MG tablet  Commonly known as:  ZYLOPRIM  Take 100 mg by mouth daily.     amitriptyline 10 MG  tablet  Commonly known as:  ELAVIL  Take 10 mg by mouth at bedtime.     aspirin 81 MG tablet  Take 81 mg by mouth daily.     azithromycin 250 MG tablet  Commonly known as:  ZITHROMAX     b complex vitamins tablet  Take 1 tablet by mouth daily.     beclomethasone 80 MCG/ACT inhaler  Commonly known as:  QVAR  Inhale 1 puff into the lungs as needed.     buPROPion 300 MG 24 hr tablet  Commonly known as:  WELLBUTRIN XL  Take 300 mg by mouth daily.     CHERATUSSIN AC 100-10 MG/5ML syrup  Generic drug:  guaiFENesin-codeine     chlorpheniramine-phenylephrine-dextromethorphan 3.5-1-3 MG/ML solution  Commonly known as:  CARDEC DM  Take 5 mLs by mouth every 6 (six) hours as needed for cough (cough).     colchicine 0.6 MG tablet     diazepam 5 MG tablet  Commonly known as:  VALIUM   Take 1 tablet (5 mg total) by mouth at bedtime as needed for muscle spasms (or sleep).     Fluticasone-Salmeterol 250-50 MCG/DOSE Aepb  Commonly known as:  ADVAIR  Inhale 1 puff into the lungs every 12 (twelve) hours.     gabapentin 100 MG capsule  Commonly known as:  NEURONTIN  Take 100 mg by mouth 3 (three) times daily.     HYDROcodone-ibuprofen 7.5-200 MG per tablet  Commonly known as:  VICOPROFEN     losartan 50 MG tablet  Commonly known as:  COZAAR  Take 50 mg by mouth daily.     metoprolol succinate 25 MG 24 hr tablet  Commonly known as:  TOPROL-XL  Take 25 mg by mouth 2 (two) times daily.     metoprolol tartrate 25 MG tablet  Commonly known as:  LOPRESSOR  Take 25 mg by mouth 2 (two) times daily as needed.     omeprazole 20 MG capsule  Commonly known as:  PRILOSEC  Take 20 mg by mouth daily.     ondansetron 4 MG disintegrating tablet  Commonly known as:  ZOFRAN ODT  Take 1 tablet (4 mg total) by mouth every 8 (eight) hours as needed for nausea.     oxyCODONE-acetaminophen 5-325 MG per tablet  Commonly known as:  PERCOCET/ROXICET  Take 2 tablets by mouth every 4 (four) hours as needed.     senna 8.6 MG tablet  Commonly known as:  SENOKOT  Take 1 tablet by mouth daily.     traZODone 50 MG tablet  Commonly known as:  DESYREL  Take 50 mg by mouth 2 (two) times daily.        Diagnostic Studies: Ct Head Wo Contrast  02/18/2014   CLINICAL DATA:  Pt fell down approx 15 cement stairs at [employer].Rt arm pain was very severe and pt was unable to stop shaking on scanner  EXAM: CT HEAD WITHOUT CONTRAST  CT CERVICAL SPINE WITHOUT CONTRAST  TECHNIQUE: Multidetector CT imaging of the head and cervical spine was performed following the standard protocol without intravenous contrast. Multiplanar CT image reconstructions of the cervical spine were also generated.  COMPARISON:  None.  FINDINGS: CT HEAD FINDINGS  No intracranial hemorrhage. No parenchymal contusion. No  midline shift or mass effect. Basilar cisterns are patent. No skull base fracture. No fluid in the paranasal sinuses or mastoid air cells. Orbits are normal.  Generalized cortical atrophy. Mild periventricular white matter hypodensities.  CT CERVICAL SPINE  FINDINGS  No prevertebral soft tissue swelling. Normal alignment of cervical vertebral bodies. No loss of vertebral body height. Normal facet articulation. Normal craniocervical junction.  No evidence epidural or paraspinal hematoma.  IMPRESSION: 1. No acute intracranial trauma. 2. Atrophy and mild white matter microvascular disease. 3. No cervical spine fracture.   Electronically Signed   By: Suzy Bouchard M.D.   On: 02/18/2014 00:19   Ct Cervical Spine Wo Contrast  02/18/2014   CLINICAL DATA:  Pt fell down approx 15 cement stairs at [employer].Rt arm pain was very severe and pt was unable to stop shaking on scanner  EXAM: CT HEAD WITHOUT CONTRAST  CT CERVICAL SPINE WITHOUT CONTRAST  TECHNIQUE: Multidetector CT imaging of the head and cervical spine was performed following the standard protocol without intravenous contrast. Multiplanar CT image reconstructions of the cervical spine were also generated.  COMPARISON:  None.  FINDINGS: CT HEAD FINDINGS  No intracranial hemorrhage. No parenchymal contusion. No midline shift or mass effect. Basilar cisterns are patent. No skull base fracture. No fluid in the paranasal sinuses or mastoid air cells. Orbits are normal.  Generalized cortical atrophy. Mild periventricular white matter hypodensities.  CT CERVICAL SPINE FINDINGS  No prevertebral soft tissue swelling. Normal alignment of cervical vertebral bodies. No loss of vertebral body height. Normal facet articulation. Normal craniocervical junction.  No evidence epidural or paraspinal hematoma.  IMPRESSION: 1. No acute intracranial trauma. 2. Atrophy and mild white matter microvascular disease. 3. No cervical spine fracture.   Electronically Signed   By:  Suzy Bouchard M.D.   On: 02/18/2014 00:19   Dg Pelvis Portable  02/17/2014   CLINICAL DATA:  [place] down steps.  Right humeral fracture.  EXAM: PORTABLE PELVIS 1-2 VIEWS  COMPARISON:  None.  FINDINGS: There is no evidence of pelvic fracture or diastasis. No pelvic bone lesions are seen.  IMPRESSION: Negative.   Electronically Signed   By: Lucienne Capers M.D.   On: 02/17/2014 23:33   Dg Chest Port 1 View  02/17/2014   CLINICAL DATA:  Trauma. Right arm fracture. Chronic cough. Smoker.  EXAM: PORTABLE CHEST - 1 VIEW  COMPARISON:  None.  FINDINGS: Shallow inspiration. Cardiac enlargement with prominent pulmonary vascularity. This suggests Mild congestion. No edema or consolidation suggested. No blunting of costophrenic angles. No pneumothorax. Old fracture deformity of the left clavicle.  IMPRESSION: Cardiac enlargement with increased pulmonary vascularity suggesting mild vascular congestion.   Electronically Signed   By: Lucienne Capers M.D.   On: 02/17/2014 23:32   Dg Shoulder Right Port  02/22/2014   CLINICAL DATA:  Postop right humerus fracture.  EXAM: PORTABLE RIGHT SHOULDER - 2+ VIEW  COMPARISON:  Preoperative exam 02/17/2014  FINDINGS: Plate and multi screw fixation of comminuted proximal humerus fracture. There is improved fracture alignment compared to preoperative exam. Persistent displacement of a butterfly fragments is seen. Postsurgical change in the soft tissues with soft tissue air. Overlying splint is in place.  IMPRESSION: Post ORIF proximal humerus fracture in improved alignment. No immediate postoperative complication.   Electronically Signed   By: Jeb Levering M.D.   On: 02/22/2014 02:06   Dg Humerus Right  02/22/2014   CLINICAL DATA:  Open reduction internal fixation right proximal humerus fracture.  EXAM: RIGHT HUMERUS - 2+ VIEW; DG C-ARM 61-120 MIN  COMPARISON:  Radiograph 02/17/2014  FINDINGS: Six fluoroscopic spot images from the operating room of the right humerus in frontal  and lateral projection demonstrate lateral plate and multi screw fixation  of comminuted proximal humerus fracture. There is improved fracture alignment. Fluoroscopy time reported 1 min 32 seconds.  IMPRESSION: Open reduction internal fixation right proximal humerus fracture. Improved alignment from preoperative exam.   Electronically Signed   By: Jeb Levering M.D.   On: 02/22/2014 02:05   Dg Humerus Right  02/17/2014   CLINICAL DATA:  [place] down stairs today with swelling and deformity to the proximal humerus.  EXAM: RIGHT HUMERUS - 2+ VIEW  COMPARISON:  None.  FINDINGS: Comminuted fractures of the proximal right humeral shaft extending to the humeral neck. There is complete lateral displacement of the distal fracture fragment with additional displaced butterfly fragments. Overriding of the distal fracture fragment with respect to the proximal fragment. Distal humerus appears intact.  IMPRESSION: Comminuted displaced acute posttraumatic fracture of the proximal right humeral shaft.   Electronically Signed   By: Lucienne Capers M.D.   On: 02/17/2014 23:31   Dg C-arm 61-120 Min  02/22/2014   CLINICAL DATA:  Open reduction internal fixation right proximal humerus fracture.  EXAM: RIGHT HUMERUS - 2+ VIEW; DG C-ARM 61-120 MIN  COMPARISON:  Radiograph 02/17/2014  FINDINGS: Six fluoroscopic spot images from the operating room of the right humerus in frontal and lateral projection demonstrate lateral plate and multi screw fixation of comminuted proximal humerus fracture. There is improved fracture alignment. Fluoroscopy time reported 1 min 32 seconds.  IMPRESSION: Open reduction internal fixation right proximal humerus fracture. Improved alignment from preoperative exam.   Electronically Signed   By: Jeb Levering M.D.   On: 02/22/2014 02:05    Disposition: 01-Home or Self Care        Follow-up Information    Follow up with Johnny Bridge, MD. Schedule an appointment as soon as possible for a visit in  2 weeks.   Specialty:  Orthopedic Surgery   Contact information:   [place] [address] [phone]        Signed: Johnny Bridge 02/23/2014, 7:27 AM

## 2014-02-23 NOTE — Clinical Social Work Placement (Signed)
Clinical Social Work Department CLINICAL SOCIAL WORK PLACEMENT NOTE 02/23/2014  Patient:  Wesley Beard, Wesley Beard  Account Number:  [id number] Admit date:  02/21/2014  Clinical Social Worker:  Wylene Men  Date/time:  02/23/2014 12:37 PM  Clinical Social Work is seeking post-discharge placement for this patient at the following level of care:   SKILLED NURSING   (*CSW will update this form in Epic as items are completed)   02/23/2014  Patient/family provided with [facility] Department of Clinical Social Work's list of facilities offering this level of care within the geographic area requested by the patient (or if unable, by the patient's family).  02/23/2014  Patient/family informed of their freedom to choose among providers that offer the needed level of care, that participate in Medicare, Medicaid or managed care program needed by the patient, have an available bed and are willing to accept the patient.  02/23/2014  Patient/family informed of [facility]' ownership interest in [facility], as well as of the fact that they are under no obligation to receive care at this facility.  PASARR submitted to EDS on 02/23/2014 PASARR number received on 02/23/2014  FL2 transmitted to all facilities in geographic area requested by pt/family on  02/23/2014 FL2 transmitted to all facilities within larger geographic area on   Patient informed that his/her managed care company has contracts with or will negotiate with  certain facilities, including the following:     Patient/family informed of bed offers received:  02/23/2014 Patient chooses bed at [facility]ician recommends and patient chooses bed at    Patient to be transferred to [facility] on  02/23/2014 Patient to be transferred to facility by [facility] Patient and family notified of transfer on 02/23/2014 Name of family member notified:  Son Roselyn Reef at bedside  The following physician request were entered in  Epic:   Additional Comments:  Nonnie Done, [place] [phone]  Psychiatric & Orthopedics (5N 1-16) Clinical Social Worker

## 2014-02-23 NOTE — Progress Notes (Signed)
This encounter was created in error - please disregard.

## 2014-02-23 NOTE — Clinical Social Work Note (Signed)
Patient will discharge today per MD order.  Patient will discharge to: [facility]  RN to call report prior to transportation to: [phone] Transportation: [facility] [phone] (SNF to complete paperwork at bedside prior to transportation being called)   CSW reviewed plans with patient, patient's son, patient's worker's compensation care coordinator Jenny Reichmann [phone]) and RN.  All parties are agreeable.    Nonnie Done, [place] [phone]  Psychiatric & Orthopedics (5N 1-16) Clinical Social Worker

## 2014-02-23 NOTE — Anesthesia Postprocedure Evaluation (Signed)
  Anesthesia Post-op Note  Patient: Wesley Beard  Procedure(s) Performed: Procedure(s): OPEN REDUCTION INTERNAL FIXATION (ORIF) RIGHT PROXIMAL HUMERUS FRACTURE AND SHAFT (Right)  Patient Location: PACU  Anesthesia Type:General and GA combined with regional for post-op pain  Level of Consciousness: awake, oriented, sedated and patient cooperative  Airway and Oxygen Therapy: Patient Spontanous Breathing  Post-op Pain: mild  Post-op Assessment: Post-op Vital signs reviewed, Patient's Cardiovascular Status Stable, Respiratory Function Stable, Patent Airway, No signs of Nausea or vomiting and Pain level controlled  Post-op Vital Signs: stable  Last Vitals:  Filed Vitals:   02/23/14 0519  BP: 113/55  Pulse: 95  Temp: 37.1 C  Resp: 19    Complications: No apparent anesthesia complications

## 2014-02-23 NOTE — Progress Notes (Signed)
Patient ID: Wesley Beard, male   DOB: 1950-03-12, 64 y.o.   MRN: [id number]     Subjective:  Patient reports pain as mild to moderate.  Patient states that he is much better and was able to rest last night better than he has in several days  Objective:   VITALS:   Filed Vitals:   02/22/14 2105 02/22/14 2117 02/22/14 2301 02/23/14 0519  BP:  110/48  113/55  Pulse:  92  95  Temp:  100.3 F (37.9 C) 98.9 F (37.2 C) 98.8 F (37.1 C)  TempSrc:  Oral Oral Oral  Resp:  17  19  Height:      SpO2: 97% 97%  95%    ABD soft Sensation intact distally Dorsiflexion/Plantar flexion intact Incision: dressing C/D/I and no drainage  Good wrist and hand motion Long arm splint in place Sling in place Hand swollen and patient using ice packs  Lab Results  Component Value Date   WBC 8.1 02/22/2014   HGB 8.9* 02/22/2014   HCT 27.3* 02/22/2014   MCV 96.1 02/22/2014   PLT 191 02/22/2014   BMET    Component Value Date/Time   NA 139 02/22/2014 0406   NA 141 06/24/2012 1028   K 3.9 02/22/2014 0406   K 4.4 06/24/2012 1028   CL 97 02/22/2014 0406   CL 106 06/24/2012 1028   CO2 30 02/22/2014 0406   CO2 27 06/24/2012 1028   GLUCOSE 130* 02/22/2014 0406   GLUCOSE 105* 06/24/2012 1028   BUN 11 02/22/2014 0406   BUN 21.5 06/24/2012 1028   CREATININE 0.85 02/22/2014 0406   CREATININE 1.2 06/24/2012 1028   CALCIUM 8.2* 02/22/2014 0406   CALCIUM 9.0 06/24/2012 1028   GFRNONAA 89* 02/22/2014 0406   GFRAA >90 02/22/2014 0406     Assessment/Plan: 2 Days Post-Op   Principal Problem:   Fracture of humerus, proximal, right, closed Active Problems:   Shoulder fracture   Advance diet Up with therapy Discharge to SNF when bed available nwb right upper ext  Continue splint at all times Continue sling at all times Okay for wrist and hand motion   DOUGLAS PARRY, BRANDON 02/23/2014, 7:14 AM  Discussed and agree with above.   Marchia Bond, MD Cell [phone]

## 2014-02-23 NOTE — Progress Notes (Signed)
02/23/14 On 02/22/14 spoke with patient and then spoke with patient's [employer]([phone]).Informed her that PTand OT are recommending SNF. She came to visit patient, gave her H and P, op note, PT eval note and OT note. Made referral to CSW and gave [place] CSW namr and phone #. Will continue to follow for d/c needs.

## 2014-02-23 NOTE — Progress Notes (Signed)
Occupational Therapy Treatment Patient Details Name: Wesley Beard MRN: [id number] DOB: 1950/04/19 Today's Date: 02/23/2014    History of present illness 64 yo male who fell at work on cement stairs at [employer] sustained a fracture of  R prox humerus with ORIF completed 02/21/14.   OT comments  Very pleasant. Completed education with pt/son regarding precautions and ROM/positioning/edema control RUE.  Anticipate D/C to SNF.   Follow Up Recommendations  SNF    Equipment Recommendations    TBD at SNF   Recommendations for Other Services      Precautions / Restrictions Precautions Precautions: Shoulder Type of Shoulder Precautions: no shoulder movement; NWB; wrist/hand AROM only Shoulder Interventions: Shoulder sling/immobilizer;Off for dressing/bathing/exercises Required Braces or Orthoses: Sling Restrictions Weight Bearing Restrictions: Yes RUE Weight Bearing: Non weight bearing              ADL                                         General ADL Comments: discussed sling use and technique to bath under R arm. Educated pt/son on positioning and useof ice and retrograde massage to reduce edema                                      Cognition   Behavior During Therapy: WFL for tasks assessed/performed Overall Cognitive Status: Within Functional Limits for tasks assessed                       Extremity/Trunk Assessment               Exercises Shoulder Exercises Wrist Flexion: AROM;Right;10 reps Wrist Extension: AROM;Right;10 reps Digit Composite Flexion: AROM;Right;10 reps Composite Extension: AROM;Right;10 reps Other Exercises Other Exercises: retrograde massage R hand  Squeeze ball    Shoulder Instructions       General Comments      Pertinent Vitals/ Pain       Pain Assessment: 0-10 Pain Score: 5  Faces Pain Scale: Hurts little more Pain Location: R shoulder Pain Descriptors / Indicators: Aching Pain  Intervention(s): Limited activity within patient's tolerance;Monitored during session;Repositioned;Ice applied  Home Living                                          Prior Functioning/Environment              Frequency Min 3X/week     Progress Toward Goals  OT Goals(current goals can now be found in the care plan section)  Progress towards OT goals: Progressing toward goals  Acute Rehab OT Goals Patient Stated Goal: to be safe at home OT Goal Formulation: With patient/family Time For Goal Achievement: 03/01/14 Potential to Achieve Goals: Good ADL Goals Pt Will Perform Upper Body Dressing: with supervision;sitting Pt/caregiver will Perform Home Exercise Program: Right Upper extremity;With Supervision;With written HEP provided Additional ADL Goal #1: Pt will perfrom bed mobility adhering to shoulder precautions at min guard level to prepare for OOB ADLs.  Plan      Co-evaluation                 End of Session     Activity Tolerance Patient tolerated  treatment well   Patient Left in bed;with call bell/phone within reach;with family/visitor present   Nurse Communication Mobility status        Time: 1216-2446 OT Time Calculation (min): 19 min  Charges: OT General Charges $OT Visit: 1 Procedure OT Treatments $Self Care/Home Management : 8-22 mins  [name],HILLARY 02/23/2014, 5:08 PM   [facility], OTR/L  [phone] 02/23/2014

## 2014-02-24 NOTE — Clinical Social Work Note (Signed)
CSW spoke with patient's RN Minna Merritts. Per Minna Merritts, transportation arranged the previous pm for 9am this morning. Patient d/c to [facility] and picked up by transportation. No further needs. CSW signing off.  [place], [place] Weekend Clinical Social Worker [phone]

## 2014-02-26 ENCOUNTER — Other Ambulatory Visit: Payer: Self-pay | Admitting: *Deleted

## 2014-02-26 ENCOUNTER — Non-Acute Institutional Stay (SKILLED_NURSING_FACILITY): Payer: No Typology Code available for payment source | Admitting: Internal Medicine

## 2014-02-26 DIAGNOSIS — S42201D Unspecified fracture of upper end of right humerus, subsequent encounter for fracture with routine healing: Secondary | ICD-10-CM

## 2014-02-26 DIAGNOSIS — K5901 Slow transit constipation: Secondary | ICD-10-CM

## 2014-02-26 DIAGNOSIS — M1A9XX0 Chronic gout, unspecified, without tophus (tophi): Secondary | ICD-10-CM

## 2014-02-26 DIAGNOSIS — K219 Gastro-esophageal reflux disease without esophagitis: Secondary | ICD-10-CM

## 2014-02-26 DIAGNOSIS — I10 Essential (primary) hypertension: Secondary | ICD-10-CM

## 2014-02-26 DIAGNOSIS — M792 Neuralgia and neuritis, unspecified: Secondary | ICD-10-CM

## 2014-02-26 DIAGNOSIS — F32A Depression, unspecified: Secondary | ICD-10-CM

## 2014-02-26 DIAGNOSIS — F329 Major depressive disorder, single episode, unspecified: Secondary | ICD-10-CM

## 2014-02-26 DIAGNOSIS — R233 Spontaneous ecchymoses: Secondary | ICD-10-CM

## 2014-02-26 DIAGNOSIS — D62 Acute posthemorrhagic anemia: Secondary | ICD-10-CM

## 2014-02-26 MED ORDER — DIAZEPAM 5 MG PO TABS: ORAL_TABLET | ORAL | Status: DC

## 2014-02-26 NOTE — Progress Notes (Signed)
Patient ID: Wesley Beard, male   DOB: [birth year], 64 y.o.   MRN: [id number]     [facility]   PCP: OSEI-BONSU,GEORGE, MD  Code Status: full code  No Known Allergies  Chief Complaint  Patient presents with  . New Admit To SNF     HPI:  64 year old patient is here for short term rehabilitation post hospital admission from 02/21/14-02/23/14 with closed fracture of proximal right humerus post fall. He underwent ORIF. He is seen in his room today. He worked well with therapy team today. He mentions that his pain is under control. He had bowel movement last night. He denies any concerns.   Review of Systems:  Constitutional: positive for fatigue. Negative for fever, chills, diaphoresis.  HENT: Negative for headache, congestion Eyes: Negative for eye pain, blurred vision, double vision and discharge.  Respiratory: Negative for shortness of breath and wheezing.  on o2. As per therapy team his o2 sat was 96-975 on exertion with o2. Has chronic cough. Cardiovascular: Negative for chest pain, palpitations, leg swelling. noticed swelling in his right hand Gastrointestinal: Negative for heartburn, nausea, vomiting, abdominal pain. Has history of constipation with bowel movement every 3-4 days and used to take stool softener at home Genitourinary: Negative for dysuria. Musculoskeletal: Negative for back pain, falls in facility. Skin: Negative for itching, rash.  Neurological: Negative for dizziness, tingling, focal weakness Psychiatric/Behavioral: Negative for depression and memory loss.    Past Medical History  Diagnosis Date  . MI, old 2010  . Heart palpitations     Dr. Otho Perl  . Depression   . Hypertension   . Stroke 2005    some left side weakness, TIA'a after CVA - last 10/2013  . Sleep apnea     does not  use cpap  . GERD (gastroesophageal reflux disease)   . Arthritis   . Cancer     basal cell skin cancer  . Atrial fibrillation   . Retinal  tear of right eye   . Fracture of humerus, proximal, right, closed 02/21/2014   Past Surgical History  Procedure Laterality Date  . Clavicle surgery Left   . Stomach surgery    . Cardiac catheterization  2010    normal  . Tonsillectomy    . Carpal tunnel release Bilateral   . Colonoscopy     Social History:   reports that he quit smoking about 7 years ago. He has never used smokeless tobacco. He reports that he drinks alcohol. He reports that he does not use illicit drugs.  Family History  Problem Relation Age of Onset  . Heart disease Mother   . Heart disease Father       Medication List       This list is accurate as of: 02/26/14 12:18 PM.  Always use your most recent med list.               acetic acid 2 % otic solution  Commonly known as:  VOSOL     albuterol 108 (90 BASE) MCG/ACT inhaler  Commonly known as:  PROVENTIL HFA;VENTOLIN HFA  Inhale 2 puffs into the lungs every 6 (six) hours as needed for wheezing.     allopurinol 100 MG tablet  Commonly known as:  ZYLOPRIM  Take 100 mg by mouth daily.     amitriptyline 10 MG tablet  Commonly known as:  ELAVIL  Take 10 mg by mouth at bedtime.     aspirin 81  MG tablet  Take 81 mg by mouth daily.     b complex vitamins tablet  Take 1 tablet by mouth daily.     beclomethasone 80 MCG/ACT inhaler  Commonly known as:  QVAR  Inhale 1 puff into the lungs as needed.     buPROPion 300 MG 24 hr tablet  Commonly known as:  WELLBUTRIN XL  Take 300 mg by mouth daily.     CHERATUSSIN AC 100-10 MG/5ML syrup  Generic drug:  guaiFENesin-codeine     chlorpheniramine-phenylephrine-dextromethorphan 3.5-1-3 MG/ML solution  Commonly known as:  CARDEC DM  Take 5 mLs by mouth every 6 (six) hours as needed for cough (cough).     diazepam 5 MG tablet  Commonly known as:  VALIUM  Take 1 tablet (5 mg total) by mouth at bedtime as needed for muscle spasms (or sleep).     Fluticasone-Salmeterol 250-50 MCG/DOSE Aepb  Commonly  known as:  ADVAIR  Inhale 1 puff into the lungs every 12 (twelve) hours.     gabapentin 100 MG capsule  Commonly known as:  NEURONTIN  Take 100 mg by mouth 3 (three) times daily.     HYDROcodone-ibuprofen 7.5-200 MG per tablet  Commonly known as:  VICOPROFEN  Take 1 tablet by mouth every 6 (six) hours as needed for moderate pain.     losartan 50 MG tablet  Commonly known as:  COZAAR  Take 50 mg by mouth daily.     metoprolol succinate 25 MG 24 hr tablet  Commonly known as:  TOPROL-XL  Take 25 mg by mouth 2 (two) times daily.     omeprazole 20 MG capsule  Commonly known as:  PRILOSEC  Take 20 mg by mouth daily.     ondansetron 4 MG disintegrating tablet  Commonly known as:  ZOFRAN ODT  Take 1 tablet (4 mg total) by mouth every 8 (eight) hours as needed for nausea.     oxyCODONE-acetaminophen 5-325 MG per tablet  Commonly known as:  PERCOCET/ROXICET  Take 1-2 tablets by mouth every 4 (four) hours as needed for severe pain.     senna 8.6 MG tablet  Commonly known as:  SENOKOT  Take 1 tablet by mouth daily.     traZODone 50 MG tablet  Commonly known as:  DESYREL  Take 50 mg by mouth 2 (two) times daily.       Physical Exam: Filed Vitals:   02/26/14 1117  BP: 122/73  Pulse: 89  Temp: 99.2 F (37.3 C)  Resp: 18    General- elderly obese male, in no acute distress Head- normocephalic, atraumatic Throat- moist mucus membrane  Neck- no cervical lymphadenopathy Cardiovascular- normal s1,s2, no murmursgood dorsalis pedis and radial pulses, no leg edema Respiratory- bilateral clear to auscultation, no wheeze, no rhonchi, no crackles, no use of accessory muscles Abdomen- bowel sounds present, soft, non tender Musculoskeletal- right arm in sling with cast, aquacel dressing on right shoulder appears clean, able to move his digits, good radial pulse, right hand edema (pt mentions this has subsided from before). able to move other extremities, no spinal and paraspinal  tenderness, normal back curvature,  Neurological- no focal deficit Skin- warm and dry, bruising on right chest, back and right shoulder area with ecchymoses noted Psychiatry- alert and oriented to person, place and time, normal mood and affect   Labs reviewed: Basic Metabolic Panel:  Recent Labs  02/17/14 2313 02/22/14 0406  NA 139 139  K 3.9 3.9  CL 105 97  CO2 23 30  GLUCOSE 102* 130*  BUN 15 11  CREATININE 1.14 0.85  CALCIUM 8.9 8.2*   CBC:  Recent Labs  02/17/14 2313 02/22/14 0406  WBC 10.5 8.1  NEUTROABS 5.5  --   HGB 13.7 8.9*  HCT 41.2 27.3*  MCV 92.0 96.1  PLT 155 191   CBG:  Recent Labs  02/18/14 0128 02/22/14 0055  GLUCAP 123* 167*    Assessment/Plan  Right humeral fracture S/p ORIF. Will have him work with physical therapy and occupational therapy team to help with gait training and muscle strengthening exercises.fall precautions. Skin care. Encourage to be out of bed. Has f/u with Dr Mardelle Matte. Continue valium 5 mg qhs prn for muscle spasm, vicoprofen 7.5-200 q6h prn pain and percocet 5-325 2 tab q4h prn pain. Continue to wear sling when up and about. Continue prn ice pack for right hand dependent edema  Constipation on senna 2 tab qhs, change this to senna s 2 tab qhs and add miralax daily prn for now  Acute blood loss anemia Likely post op , check h&h  Ecchymoses and bruising Likely post fall from trauma. Monitor h&h with coag next lab draw  Gout No recent flare, continue zyloprim 100 mg daily  Depression Mood stable, continue elavil 10 mg daily and wellbutrin xl 300 mg daily with desyrel 50 mg bid  HTN bp stable, continue cozaar 50 mg daily and toprol xl 25 mg bid, monitor bmp  Neuropathic pain Stable on gabapentin 100 mg tid, continue this and monitor  gerd Reflux symptoms controlled, continue prilosec 20 mg daily   Goals of care: short term rehabilitation    Labs/tests ordered: cbc, pt/ptt, inr, bmp  Family/ staff  Communication: reviewed care plan with patient and nursing supervisor    Blanchie Serve, MD  [facility] [phone] (Monday-Friday 8 am - 5 pm) [phone] (afterhours)

## 2014-02-26 NOTE — Telephone Encounter (Signed)
[facility] 

## 2014-02-28 ENCOUNTER — Encounter (HOSPITAL_COMMUNITY): Payer: Self-pay | Admitting: Orthopedic Surgery

## 2014-03-05 ENCOUNTER — Encounter: Payer: Self-pay | Admitting: Adult Health

## 2014-03-05 ENCOUNTER — Other Ambulatory Visit: Payer: Self-pay | Admitting: *Deleted

## 2014-03-05 ENCOUNTER — Non-Acute Institutional Stay (SKILLED_NURSING_FACILITY): Payer: No Typology Code available for payment source | Admitting: Adult Health

## 2014-03-05 DIAGNOSIS — I10 Essential (primary) hypertension: Secondary | ICD-10-CM

## 2014-03-05 DIAGNOSIS — K5900 Constipation, unspecified: Secondary | ICD-10-CM

## 2014-03-05 DIAGNOSIS — M792 Neuralgia and neuritis, unspecified: Secondary | ICD-10-CM

## 2014-03-05 DIAGNOSIS — D62 Acute posthemorrhagic anemia: Secondary | ICD-10-CM

## 2014-03-05 DIAGNOSIS — F32A Depression, unspecified: Secondary | ICD-10-CM

## 2014-03-05 DIAGNOSIS — F329 Major depressive disorder, single episode, unspecified: Secondary | ICD-10-CM

## 2014-03-05 DIAGNOSIS — M1A9XX0 Chronic gout, unspecified, without tophus (tophi): Secondary | ICD-10-CM

## 2014-03-05 DIAGNOSIS — S42201D Unspecified fracture of upper end of right humerus, subsequent encounter for fracture with routine healing: Secondary | ICD-10-CM

## 2014-03-05 DIAGNOSIS — K219 Gastro-esophageal reflux disease without esophagitis: Secondary | ICD-10-CM

## 2014-03-05 MED ORDER — OXYCODONE-ACETAMINOPHEN 5-325 MG PO TABS: ORAL_TABLET | ORAL | Status: DC

## 2014-03-05 NOTE — Telephone Encounter (Signed)
[facility] 

## 2014-03-05 NOTE — Progress Notes (Signed)
Patient ID: Wesley Beard, male   DOB: 02/03/1951, 64 y.o.   MRN: [id number]   03/05/2014  Facility:  Nursing Home Location:  [place] Room Number: 606-P LEVEL OF CARE:  SNF (31)   Chief Complaint  Patient presents with  . Discharge Note    Right humerus fracture S/P ORIF, anemia, constipation, gout, depression, hypertension, neuropathy and GERD    HISTORY OF PRESENT ILLNESS:  This is a 64 year old male who is for discharge home with home health PT and OT. DME:  3 in 1 bedside commode. He has been admitted to [facility] on 02/24/14 from [facility] with right humerus fracture post fall and had ORIF. He has PMH significant for HTN, Depression, Gout and GERD. Patient was admitted to this facility for short-term rehabilitation after the patient's recent hospitalization.  Patient has completed SNF rehabilitation and therapy has cleared the patient for discharge.   PAST MEDICAL HISTORY:  Past Medical History  Diagnosis Date  . MI, old 2010  . Heart palpitations     Dr. Otho Perl  . Depression   . Hypertension   . Stroke 2005    some left side weakness, TIA'a after CVA - last 10/2013  . Sleep apnea     does not  use cpap  . GERD (gastroesophageal reflux disease)   . Arthritis   . Cancer     basal cell skin cancer  . Atrial fibrillation   . Retinal tear of right eye   . Fracture of humerus, proximal, right, closed 02/21/2014    CURRENT MEDICATIONS: Reviewed per MAR/see medication list  No Known Allergies   REVIEW OF SYSTEMS:  GENERAL: no change in appetite, no fatigue, no weight changes, no fever, chills or weakness RESPIRATORY: no cough, SOB, DOE, wheezing, hemoptysis CARDIAC: no chest pain, edema or palpitations GI: no abdominal pain, diarrhea, constipation, heart burn, nausea or vomiting  PHYSICAL EXAMINATION  GENERAL: no acute distress, obese EYES: conjunctivae normal, sclerae normal, normal eye lids NECK: supple, trachea  midline, no neck masses, no thyroid tenderness, no thyromegaly LYMPHATICS: no LAN in the neck, no supraclavicular LAN RESPIRATORY: breathing is even & unlabored, BS CTAB CARDIAC: RRR, no murmur,no extra heart sounds, Right hand edema 1+ GI: abdomen soft, normal BS, no masses, no tenderness, no hepatomegaly, no splenomegaly EXTREMITIES: Able to move 4 extremities; RUE on sling and splint PSYCHIATRIC: the patient is alert & oriented to person, affect & behavior appropriate  LABS/RADIOLOGY: Labs reviewed: Basic Metabolic Panel:  Recent Labs  02/17/14 2313 02/22/14 0406  NA 139 139  K 3.9 3.9  CL 105 97  CO2 23 30  GLUCOSE 102* 130*  BUN 15 11  CREATININE 1.14 0.85  CALCIUM 8.9 8.2*   CBC:  Recent Labs  02/17/14 2313 02/22/14 0406  WBC 10.5 8.1  NEUTROABS 5.5  --   HGB 13.7 8.9*  HCT 41.2 27.3*  MCV 92.0 96.1  PLT 155 191   CBG:  Recent Labs  02/18/14 0128 02/22/14 0055  GLUCAP 123* 167*    Ct Head Wo Contrast  02/18/2014   CLINICAL DATA:  Pt fell down approx 15 cement stairs at [employer].Rt arm pain was very severe and pt was unable to stop shaking on scanner  EXAM: CT HEAD WITHOUT CONTRAST  CT CERVICAL SPINE WITHOUT CONTRAST  TECHNIQUE: Multidetector CT imaging of the head and cervical spine was performed following the standard protocol without intravenous contrast. Multiplanar CT image reconstructions of the  cervical spine were also generated.  COMPARISON:  None.  FINDINGS: CT HEAD FINDINGS  No intracranial hemorrhage. No parenchymal contusion. No midline shift or mass effect. Basilar cisterns are patent. No skull base fracture. No fluid in the paranasal sinuses or mastoid air cells. Orbits are normal.  Generalized cortical atrophy. Mild periventricular white matter hypodensities.  CT CERVICAL SPINE FINDINGS  No prevertebral soft tissue swelling. Normal alignment of cervical vertebral bodies. No loss of vertebral body height. Normal facet articulation. Normal  craniocervical junction.  No evidence epidural or paraspinal hematoma.  IMPRESSION: 1. No acute intracranial trauma. 2. Atrophy and mild white matter microvascular disease. 3. No cervical spine fracture.   Electronically Signed   By: Suzy Bouchard M.D.   On: 02/18/2014 00:19   Ct Cervical Spine Wo Contrast  02/18/2014   CLINICAL DATA:  Pt fell down approx 15 cement stairs at [employer].Rt arm pain was very severe and pt was unable to stop shaking on scanner  EXAM: CT HEAD WITHOUT CONTRAST  CT CERVICAL SPINE WITHOUT CONTRAST  TECHNIQUE: Multidetector CT imaging of the head and cervical spine was performed following the standard protocol without intravenous contrast. Multiplanar CT image reconstructions of the cervical spine were also generated.  COMPARISON:  None.  FINDINGS: CT HEAD FINDINGS  No intracranial hemorrhage. No parenchymal contusion. No midline shift or mass effect. Basilar cisterns are patent. No skull base fracture. No fluid in the paranasal sinuses or mastoid air cells. Orbits are normal.  Generalized cortical atrophy. Mild periventricular white matter hypodensities.  CT CERVICAL SPINE FINDINGS  No prevertebral soft tissue swelling. Normal alignment of cervical vertebral bodies. No loss of vertebral body height. Normal facet articulation. Normal craniocervical junction.  No evidence epidural or paraspinal hematoma.  IMPRESSION: 1. No acute intracranial trauma. 2. Atrophy and mild white matter microvascular disease. 3. No cervical spine fracture.   Electronically Signed   By: Suzy Bouchard M.D.   On: 02/18/2014 00:19   Dg Pelvis Portable  02/17/2014   CLINICAL DATA:  [place] down steps.  Right humeral fracture.  EXAM: PORTABLE PELVIS 1-2 VIEWS  COMPARISON:  None.  FINDINGS: There is no evidence of pelvic fracture or diastasis. No pelvic bone lesions are seen.  IMPRESSION: Negative.   Electronically Signed   By: Lucienne Capers M.D.   On: 02/17/2014 23:33   Dg Chest Port 1  View  02/17/2014   CLINICAL DATA:  Trauma. Right arm fracture. Chronic cough. Smoker.  EXAM: PORTABLE CHEST - 1 VIEW  COMPARISON:  None.  FINDINGS: Shallow inspiration. Cardiac enlargement with prominent pulmonary vascularity. This suggests Mild congestion. No edema or consolidation suggested. No blunting of costophrenic angles. No pneumothorax. Old fracture deformity of the left clavicle.  IMPRESSION: Cardiac enlargement with increased pulmonary vascularity suggesting mild vascular congestion.   Electronically Signed   By: Lucienne Capers M.D.   On: 02/17/2014 23:32   Dg Shoulder Right Port  02/22/2014   CLINICAL DATA:  Postop right humerus fracture.  EXAM: PORTABLE RIGHT SHOULDER - 2+ VIEW  COMPARISON:  Preoperative exam 02/17/2014  FINDINGS: Plate and multi screw fixation of comminuted proximal humerus fracture. There is improved fracture alignment compared to preoperative exam. Persistent displacement of a butterfly fragments is seen. Postsurgical change in the soft tissues with soft tissue air. Overlying splint is in place.  IMPRESSION: Post ORIF proximal humerus fracture in improved alignment. No immediate postoperative complication.   Electronically Signed   By: Jeb Levering M.D.   On: 02/22/2014 02:06  Dg Humerus Right  02/22/2014   CLINICAL DATA:  Open reduction internal fixation right proximal humerus fracture.  EXAM: RIGHT HUMERUS - 2+ VIEW; DG C-ARM 61-120 MIN  COMPARISON:  Radiograph 02/17/2014  FINDINGS: Six fluoroscopic spot images from the operating room of the right humerus in frontal and lateral projection demonstrate lateral plate and multi screw fixation of comminuted proximal humerus fracture. There is improved fracture alignment. Fluoroscopy time reported 1 min 32 seconds.  IMPRESSION: Open reduction internal fixation right proximal humerus fracture. Improved alignment from preoperative exam.   Electronically Signed   By: Jeb Levering M.D.   On: 02/22/2014 02:05   Dg Humerus  Right  02/17/2014   CLINICAL DATA:  [place] down stairs today with swelling and deformity to the proximal humerus.  EXAM: RIGHT HUMERUS - 2+ VIEW  COMPARISON:  None.  FINDINGS: Comminuted fractures of the proximal right humeral shaft extending to the humeral neck. There is complete lateral displacement of the distal fracture fragment with additional displaced butterfly fragments. Overriding of the distal fracture fragment with respect to the proximal fragment. Distal humerus appears intact.  IMPRESSION: Comminuted displaced acute posttraumatic fracture of the proximal right humeral shaft.   Electronically Signed   By: Lucienne Capers M.D.   On: 02/17/2014 23:31   Dg C-arm 61-120 Min  02/22/2014   CLINICAL DATA:  Open reduction internal fixation right proximal humerus fracture.  EXAM: RIGHT HUMERUS - 2+ VIEW; DG C-ARM 61-120 MIN  COMPARISON:  Radiograph 02/17/2014  FINDINGS: Six fluoroscopic spot images from the operating room of the right humerus in frontal and lateral projection demonstrate lateral plate and multi screw fixation of comminuted proximal humerus fracture. There is improved fracture alignment. Fluoroscopy time reported 1 min 32 seconds.  IMPRESSION: Open reduction internal fixation right proximal humerus fracture. Improved alignment from preoperative exam.   Electronically Signed   By: Jeb Levering M.D.   On: 02/22/2014 02:05    ASSESSMENT/PLAN:   Right humeral fracture S/ ORIF. - for home health PT and OT ; follow up with Dr.  Mardelle Matte - orthopedics;  Continue sling on RUE. continue valium 5 mg qhs prn for muscle spasm, vicoprofen 7.5-200 q6h prn pain and percocet 5-325 2 tab q4h prn pain Constipation - Continue senna s 2 tab qhs and  miralax daily prn  Acute blood loss anemia hgb 8.9; stable Gout - continue zyloprim 100 mg daily Depression - mood stable; continue elavil 10 mg daily and wellbutrin xl 300 mg daily with desyrel 50 mg bid HTN - well-controlled; continue cozaar 50 mg daily  and toprol xl 25 mg bid Neuropathy - stable on gabapentin 100 mg tid gerd -  well-controlled, continue prilosec 20 mg daily   I have filled out patient's discharge paperwork and written prescriptions.  Patient will receive home health PT and OT.  DME provided: 3 in 1 bedside commode  Total discharge time: Greater than 30 minutes  Discharge time involved coordination of the discharge process with social worker, nursing staff and therapy department. Medical justification for home health services/DME verified.     [facility], NP [employer] [phone]

## 2014-05-18 ENCOUNTER — Other Ambulatory Visit: Payer: Self-pay | Admitting: Physician Assistant

## 2014-05-18 DIAGNOSIS — R053 Chronic cough: Secondary | ICD-10-CM

## 2014-05-18 DIAGNOSIS — R05 Cough: Secondary | ICD-10-CM

## 2014-05-21 ENCOUNTER — Other Ambulatory Visit: Payer: Self-pay | Admitting: Physician Assistant

## 2014-05-21 DIAGNOSIS — R0602 Shortness of breath: Secondary | ICD-10-CM

## 2014-05-21 DIAGNOSIS — R062 Wheezing: Secondary | ICD-10-CM

## 2014-05-22 ENCOUNTER — Ambulatory Visit
Admission: RE | Admit: 2014-05-22 | Discharge: 2014-05-22 | Disposition: A | Discharge status: Home / Self Care | Payer: Medicare Other | Source: Ambulatory Visit | Attending: Physician Assistant | Admitting: Physician Assistant

## 2014-05-22 DIAGNOSIS — R062 Wheezing: Secondary | ICD-10-CM

## 2014-05-22 DIAGNOSIS — R0602 Shortness of breath: Secondary | ICD-10-CM

## 2014-05-22 MED ORDER — IOPAMIDOL (ISOVUE-300) INJECTION 61%
75.0000 mL | Freq: Once | INTRAVENOUS | Status: AC | PRN
  Administered 2014-05-22: 75 mL via INTRAVENOUS

## 2014-06-08 ENCOUNTER — Encounter (INDEPENDENT_AMBULATORY_CARE_PROVIDER_SITE_OTHER): Payer: Self-pay

## 2014-06-08 ENCOUNTER — Ambulatory Visit (INDEPENDENT_AMBULATORY_CARE_PROVIDER_SITE_OTHER)
Admission: RE | Admit: 2014-06-08 | Discharge: 2014-06-08 | Disposition: A | Discharge status: Home / Self Care | Payer: Medicare Other | Source: Ambulatory Visit | Attending: Internal Medicine | Admitting: Internal Medicine

## 2014-06-08 ENCOUNTER — Other Ambulatory Visit (INDEPENDENT_AMBULATORY_CARE_PROVIDER_SITE_OTHER): Payer: Medicare Other

## 2014-06-08 ENCOUNTER — Ambulatory Visit (INDEPENDENT_AMBULATORY_CARE_PROVIDER_SITE_OTHER): Payer: Medicare Other | Admitting: Internal Medicine

## 2014-06-08 ENCOUNTER — Telehealth: Payer: Self-pay | Admitting: Internal Medicine

## 2014-06-08 ENCOUNTER — Encounter: Payer: Self-pay | Admitting: Internal Medicine

## 2014-06-08 VITALS — BP 104/70 | HR 106 | Ht 68.0 in | Wt 230.0 lb

## 2014-06-08 DIAGNOSIS — R05 Cough: Secondary | ICD-10-CM

## 2014-06-08 DIAGNOSIS — J8410 Pulmonary fibrosis, unspecified: Secondary | ICD-10-CM

## 2014-06-08 DIAGNOSIS — R059 Cough, unspecified: Secondary | ICD-10-CM

## 2014-06-08 LAB — CBC WITH DIFFERENTIAL/PLATELET
BASOS ABS: 0 10*3/uL (ref 0.0–0.1)
BASOS PCT: 0.4 % (ref 0.0–3.0)
EOS PCT: 3.1 % (ref 0.0–5.0)
Eosinophils Absolute: 0.3 10*3/uL (ref 0.0–0.7)
HEMATOCRIT: 42.6 % (ref 39.0–52.0)
Hemoglobin: 14.2 g/dL (ref 13.0–17.0)
LYMPHS ABS: 2.6 10*3/uL (ref 0.7–4.0)
Lymphocytes Relative: 24 % (ref 12.0–46.0)
MCHC: 33.4 g/dL (ref 30.0–36.0)
MCV: 84.1 fl (ref 78.0–100.0)
Monocytes Absolute: 0.9 10*3/uL (ref 0.1–1.0)
Monocytes Relative: 8.5 % (ref 3.0–12.0)
NEUTROS ABS: 7.1 10*3/uL (ref 1.4–7.7)
Neutrophils Relative %: 64 % (ref 43.0–77.0)
PLATELETS: 204 10*3/uL (ref 150.0–400.0)
RBC: 5.07 Mil/uL (ref 4.22–5.81)
RDW: 15.9 % — ABNORMAL HIGH (ref 11.5–15.5)
WBC: 11 10*3/uL — ABNORMAL HIGH (ref 4.0–10.5)

## 2014-06-08 LAB — SEDIMENTATION RATE: Sed Rate: 27 mm/hr — ABNORMAL HIGH (ref 0–22)

## 2014-06-08 LAB — RHEUMATOID FACTOR: Rhuematoid fact SerPl-aCnc: <10 IU/mL (ref <=14)

## 2014-06-08 MED ORDER — FAMOTIDINE 20 MG PO TABS: ORAL_TABLET | ORAL | Status: AC

## 2014-06-08 MED ORDER — TRAMADOL HCL 50 MG PO TABS: ORAL_TABLET | ORAL | Status: DC

## 2014-06-08 NOTE — Progress Notes (Signed)
Quick Note:  LMTCB ______ 

## 2014-06-08 NOTE — Progress Notes (Signed)
Subjective:    Patient ID: Wesley Beard, male    DOB: 25-Jul-1950,    MRN: [id number]  HPI  61 yowm  last smoked x 2008 with onset of cough around 2014 gradually worse with "neg w/u" by [facility] at the onset of the cough but gradually worse since then so referred by Livia Snellen PA for Dr Emilee Hero - bonsu to pulmonary clinic 06/08/2014    . 06/08/2014 1st  Pulmonary office visit/ Wesley Beard   Chief Complaint  Patient presents with  . Pulmonary Consult    Referred by Dr. Vista Lawman. Pt c/o cough and SOB for the past 2 yrs- worse since Jan 2016.  His cough is prod with clear sputum, wakes up sometimes with cough.  He states that he feels SOB all of the time-exertion makes SOB and coug worse.   indolent onset x 2 y progressively worse doe across a parking lot assoc with worse cough with deeper breath  No better with prednisone No assoc arthritis  Has palpitation hx rx with BB  but never amiodarone / chemo  Cough is mosly dry , occ maybe a tsp with a violent cough but this is clear  No obvious  patterns in day to day or daytime variabilty or assoc purulent or bloody mucus  cp or chest tightness, subjective wheeze overt sinus or hb symptoms. No unusual exp hx or h/o childhood pna/ asthma or knowledge of premature birth.  Sleeping ok without nocturnal  or early am exacerbation  of respiratory  c/o's or need for noct saba. Also denies any obvious fluctuation of symptoms with weather or environmental changes or other aggravating or alleviating factors except as outlined above   Current Medications, Allergies, Complete Past Medical History, Past Surgical History, Family History, and Social History were reviewed in [employer] record.             Review of Systems  Constitutional: Negative for fever, chills, activity change, appetite change and unexpected weight change.  HENT: Positive for postnasal drip and rhinorrhea. Negative for congestion, dental problem,  sneezing, sore throat, trouble swallowing and voice change.   Eyes: Negative for visual disturbance.  Respiratory: Positive for cough and shortness of breath. Negative for choking.   Cardiovascular: Negative for chest pain and leg swelling.  Gastrointestinal: Negative for nausea, vomiting and abdominal pain.  Genitourinary: Negative for difficulty urinating.  Musculoskeletal: Negative for arthralgias.  Skin: Negative for rash.  Psychiatric/Behavioral: Negative for behavioral problems and confusion.       Objective:   Physical Exam amb wm with harsh cough  Wt Readings from Last 3 Encounters:  06/08/14 230 lb (104.327 kg)  03/05/14 247 lb 6.4 oz (112.22 kg)  02/17/14 220 lb (99.791 kg)    Vital signs reviewed    HEENT:top dentures/  nl turbinates, and orophanx. Nl external ear canals without cough reflex   NECK :  without JVD/Nodes/TM/ nl carotid upstrokes bilaterally   LUNGS: no acc muscle use, classic BV bs bilaterally with coarse insp crackles half way up and cough on each deep breath    CV:  RRR  no s3 or murmur or increase in P2, no edema   ABD:  soft and nontender with nl excursion in the supine position. No bruits or organomegaly, bowel sounds nl  MS:  warm without deformities, calf tenderness, cyanosis - bilateral mild upper ext clubbing SKIN: warm and dry without lesions    NEURO:  alert, approp, no deficits   CXR  PA and Lateral:   06/08/2014 :     I personally reviewed images and agree with radiology impression as follows:    1. Diffuse bilateral from interstitial prominence again noted consistent chronic interstitial lung disease. Mild active pneumonitis cannot be excluded. 2. Stable cardiomegaly.          Assessment & Plan:

## 2014-06-08 NOTE — Telephone Encounter (Signed)
Result Notes     Notes Recorded by Rosana Berger, CMA on 06/08/2014 at 2:21 PM [facility] ------  Notes Recorded by Tanda Rockers, MD on 06/08/2014 at 2:03 PM Call pt: Reviewed cxr and no acute change (fibrosis is present and w/u is underway) so no change in recommendations made at ov     Pt is aware of results. Nothing further was needed.

## 2014-06-08 NOTE — Patient Instructions (Signed)
Stop tessilon and your inhalers since they are not working   First take delsym two tsp every 12 hours and supplement if needed with  tramadol 50 mg up to 2 every 4 hours to suppress the urge to cough at all or even clear your throat. Swallowing water or using ice chips/non mint and menthol containing candies (such as lifesavers or sugarless jolly ranchers) are also effective.  You should rest your voice and avoid activities that you know make you cough.  Once you have eliminated the cough for 3 straight days try reducing the tramadol first,  then the delsym as tolerated.    Prednisone 10 mg take  4 each am x 2 days,   2 each am x 2 days,  1 each am x 2 days and stop (this is to eliminate allergies and inflammation from coughing)  Protonix (pantoprazole) Take 30-60 min before first  And last meal of the day and Pepcid 20 mg one bedtime until return   GERD (REFLUX)  is an extremely common cause of respiratory symptoms, many times with no significant heartburn at all.    It can be treated with medication, but also with lifestyle changes including avoidance of late meals, excessive alcohol, smoking cessation, and avoid fatty foods, chocolate, peppermint, colas, red wine, and acidic juices such as orange juice.  NO MINT OR MENTHOL PRODUCTS SO NO COUGH DROPS  USE HARD CANDY INSTEAD (jolley ranchers or Stover's or Lifesavers (all available in sugarless versions) NO OIL BASED VITAMINS - use powdered substitutes.  Please remember to go to the lab and x-ray department downstairs for your tests - we will call you with the results when they are available.  Please schedule a follow up office visit in 2 weeks, sooner if needed with pfts first at [facility] but you will need to take your cough medications with you

## 2014-06-09 ENCOUNTER — Encounter: Payer: Self-pay | Admitting: Internal Medicine

## 2014-06-09 DIAGNOSIS — R05 Cough: Secondary | ICD-10-CM | POA: Insufficient documentation

## 2014-06-09 DIAGNOSIS — R059 Cough, unspecified: Secondary | ICD-10-CM | POA: Insufficient documentation

## 2014-06-09 NOTE — Assessment & Plan Note (Addendum)
-  symptoms started 2014  - CT with contrast 05/22/14 > Increasing peripheral reticulation and ground-glass opacities throughout both lungs, right greater than left. Findings likely reflect worsening chronic interstitial lung disease. - 06/09/2014  Walked RA  2 laps @ 185 ft each stopped due to  Cough/ sob/ no desat/ slow pace  - collage vasc profile 06/08/2014 >  ESR 27   DDx for pulmonary fibrosis  includes idiopathic pulmonary fibrosis, pulmonary fibrosis associated with rheumatologic diseases (which have a relatively benign course in most cases) , fibrotic phases of BOOP (unlikely since he didn't respond to steroids) adverse effect from  drugs such as chemotherapy or amiodarone exposure(no hx to support exposure) , nonspecific interstitial pneumonia which is typically steroid responsive(and his was not), and chronic hypersensitivity pneumonitis.   In active  smokers Langerhan's Cell  Histiocyctosis (eosinophilic granuomatosis),  DIP,  and Respiratory Bronchiolitis ILD also need to be considered,  But his smoking hx is remote   Unfortunately this leaves [place] with the likelihood this is a form of UIP and needs to complete the w/u with pfts and HRCT  In meantime, Use of PPI is associated with improved survival time and with decreased radiologic fibrosis per King's study published in AJRCCM vol 184 p1390.  Dec 2011  This may not be cause and effect, but given how universally unimpressive and expensive  all the other  Drugs developed to day  have been for pf,   rec start  rx ppi / diet/ lifestyle modification and f/u with serial walking sats and lung volumes for now to put more points on the curve / establish firm baseline before considering additional measures.   Also will address the cough (see cough a/p)

## 2014-06-09 NOTE — Assessment & Plan Note (Signed)
Most of his cough is reproducible on insp c/w PF assoc cough but At least a portion of his cough appears to be from    Upper airway cough syndrome, so named because it's frequently impossible to sort out how much is  CR/sinusitis with freq throat clearing (which can be related to primary GERD)   vs  causing  secondary (" extra esophageal")  GERD from wide swings in gastric pressure that occur with throat clearing, often  promoting self use of mint and menthol lozenges that reduce the lower esophageal sphincter tone and exacerbate the problem further in a cyclical fashion.   These are the same pts (now being labeled as having "irritable larynx syndrome" by some cough centers) who not infrequently have a history of having failed to tolerate ace inhibitors,  dry powder inhalers or biphosphonates or report having atypical reflux symptoms that don't respond to standard doses of PPI , and are easily confused as having aecopd or asthma flares by even experienced allergists/ pulmonologists.   rec eliminated cyclical coughing while on max gerd rx then regroup.

## 2014-06-11 LAB — ANA: Anti Nuclear Antibody(ANA): NEGATIVE

## 2014-06-12 LAB — CYCLIC CITRUL PEPTIDE ANTIBODY, IGG: Cyclic Citrullin Peptide Ab: <2 U/mL (ref 0.0–5.0)

## 2014-06-12 NOTE — Progress Notes (Signed)
Quick Note:  Spoke with pt and notified of results per Dr. Wert. Pt verbalized understanding and denied any questions.  ______ 

## 2014-06-22 ENCOUNTER — Telehealth: Payer: Self-pay | Admitting: Internal Medicine

## 2014-06-22 DIAGNOSIS — J8410 Pulmonary fibrosis, unspecified: Secondary | ICD-10-CM

## 2014-06-22 MED ORDER — TRAMADOL HCL 50 MG PO TABS: ORAL_TABLET | ORAL | Status: DC

## 2014-06-22 NOTE — Telephone Encounter (Signed)
Yes especially since the cough is back it needs to be done but in meantime restart the trmadol until the ov and we'll regroup then   #40 on the refill

## 2014-06-22 NOTE — Telephone Encounter (Signed)
Spoke with Wesley Beard, states that at last OV he as given a new medication for his cough; tramadol.  This medication did help to control his cough 100% Wesley Beard is wanting to know if something else can be given to help subside cough - reports following regimen given 06/08/14 as directed and still has cough.  Wesley Beard has PFT scheduled for Monday 06/25/14 and is wanting to know if he needs to still do this with his cough being increased.   Patient Instructions 06/08/14    Stop tessilon and your inhalers since they are not working   First take delsym two tsp every 12 hours and supplement if needed with tramadol 50 mg up to 2 every 4 hours to suppress the urge to cough at all or even clear your throat. Swallowing water or using ice chips/non mint and menthol containing candies (such as lifesavers or sugarless jolly ranchers) are also effective. You should rest your voice and avoid activities that you know make you cough.  Once you have eliminated the cough for 3 straight days try reducing the tramadol first, then the delsym as tolerated.   Prednisone 10 mg take 4 each am x 2 days, 2 each am x 2 days, 1 each am x 2 days and stop (this is to eliminate allergies and inflammation from coughing)  Protonix (pantoprazole) Take 30-60 min before first And last meal of the day and Pepcid 20 mg one bedtime until return   GERD (REFLUX) is an extremely common cause of respiratory symptoms, many times with no significant heartburn at all.   It can be treated with medication, but also with lifestyle changes including avoidance of late meals, excessive alcohol, smoking cessation, and avoid fatty foods, chocolate, peppermint, colas, red wine, and acidic juices such as orange juice.  NO MINT OR MENTHOL PRODUCTS SO NO COUGH DROPS  USE HARD CANDY INSTEAD (jolley ranchers or Stover's or Lifesavers (all available in sugarless versions) NO OIL BASED VITAMINS - use powdered substitutes.  Please remember to go to the lab and  x-ray department downstairs for your tests - we will call you with the results when they are available.  Please schedule a follow up office visit in 2 weeks, sooner if needed with pfts first at [facility] but you will need to take your cough medications with you    Please advise Dr Melvyn Novas. Thanks.

## 2014-06-22 NOTE — Telephone Encounter (Signed)
Pt is aware. Rx has been called in. Nothing further was needed.

## 2014-06-25 ENCOUNTER — Encounter: Payer: Self-pay | Admitting: Internal Medicine

## 2014-06-25 ENCOUNTER — Ambulatory Visit (INDEPENDENT_AMBULATORY_CARE_PROVIDER_SITE_OTHER): Payer: Medicare Other | Admitting: Internal Medicine

## 2014-06-25 ENCOUNTER — Ambulatory Visit (HOSPITAL_COMMUNITY)
Admission: RE | Admit: 2014-06-25 | Discharge: 2014-06-25 | Disposition: A | Discharge status: Home / Self Care | Payer: Medicare Other | Source: Ambulatory Visit | Attending: Internal Medicine | Admitting: Internal Medicine

## 2014-06-25 VITALS — BP 116/82 | HR 78 | Ht 68.0 in | Wt 231.0 lb

## 2014-06-25 DIAGNOSIS — J8410 Pulmonary fibrosis, unspecified: Secondary | ICD-10-CM | POA: Diagnosis not present

## 2014-06-25 DIAGNOSIS — R05 Cough: Secondary | ICD-10-CM

## 2014-06-25 DIAGNOSIS — R059 Cough, unspecified: Secondary | ICD-10-CM

## 2014-06-25 LAB — PULMONARY FUNCTION TEST
DL/VA % PRED: 104 %
DL/VA: 4.66 ml/min/mmHg/L
DLCO UNC % PRED: 55 %
DLCO cor % pred: 56 %
DLCO cor: 16.73 ml/min/mmHg
DLCO unc: 16.54 ml/min/mmHg
FEF 25-75 POST: 2.85 L/s
FEF 25-75 Pre: 2.93 L/sec
FEF2575-%Change-Post: -2 %
FEF2575-%Pred-Post: 114 %
FEF2575-%Pred-Pre: 118 %
FEV1-%Change-Post: 2 %
FEV1-%Pred-Post: 76 %
FEV1-%Pred-Pre: 74 %
FEV1-Post: 2.4 L
FEV1-Pre: 2.34 L
FEV1FVC-%Change-Post: 1 %
FEV1FVC-%Pred-Pre: 113 %
FEV6-%Change-Post: 1 %
FEV6-%Pred-Post: 70 %
FEV6-%Pred-Pre: 69 %
FEV6-POST: 2.81 L
FEV6-PRE: 2.77 L
FEV6FVC-%CHANGE-POST: 0 %
FEV6FVC-%PRED-POST: 105 %
FEV6FVC-%Pred-Pre: 104 %
FVC-%CHANGE-POST: 0 %
FVC-%Pred-Post: 66 %
FVC-%Pred-Pre: 65 %
FVC-POST: 2.81 L
FVC-Pre: 2.79 L
POST FEV6/FVC RATIO: 100 %
PRE FEV1/FVC RATIO: 84 %
PRE FEV6/FVC RATIO: 99 %
Post FEV1/FVC ratio: 85 %
RV % PRED: 80 %
RV: 1.82 L
TLC % PRED: 70 %
TLC: 4.65 L

## 2014-06-25 MED ORDER — ALBUTEROL SULFATE (2.5 MG/3ML) 0.083% IN NEBU
2.5000 mg | INHALATION_SOLUTION | Freq: Once | RESPIRATORY_TRACT | Status: AC
  Administered 2014-06-25: 2.5 mg via RESPIRATORY_TRACT

## 2014-06-25 MED ORDER — ACETAMINOPHEN-CODEINE #3 300-30 MG PO TABS: ORAL_TABLET | ORAL | Status: AC

## 2014-06-25 MED ORDER — PANTOPRAZOLE SODIUM 40 MG PO TBEC: 40.0000 mg | DELAYED_RELEASE_TABLET | Freq: Two times a day (BID) | ORAL | Status: DC

## 2014-06-25 MED ORDER — GABAPENTIN 300 MG PO CAPS: 300.0000 mg | ORAL_CAPSULE | Freq: Three times a day (TID) | ORAL | Status: DC

## 2014-06-25 MED ORDER — PREDNISONE 10 MG PO TABS: ORAL_TABLET | ORAL | Status: DC

## 2014-06-25 NOTE — Patient Instructions (Addendum)
Prednisone 10 mg Take 4 for three days 3 for three days 2 for three days 1 for three days and stop   Protonix (pantoprazole) Take 30-60 min before first and last meal of the day and Pepcid 20 mg one bedtime until return   Take delsym two tsp every 12 hours and supplement if needed with  Tylenol #3  up to 2 every 4 hours to suppress the urge to cough. Swallowing water or using ice chips/non mint and menthol containing candies (such as lifesavers or sugarless jolly ranchers) are also effective.  You should rest your voice and avoid activities that you know make you cough.  Once you have eliminated the cough for 3 straight days try reducing the tylenol #3 ,  then the delsym as tolerated.    Restart the  gabadentin to 300 mg  three times a day   See Tammy NP  in 2 weeks with all your medications, even over the counter meds, separated in two separate bags, the ones you take no matter what vs the ones you stop once you feel better and take only as needed when you feel you need them.   Tammy  will generate for you a new user friendly medication calendar that will put [place] all on the same page re: your medication use.     Without this process, it simply isn't possible to assure that we are providing  your outpatient care  with  the attention to detail we feel you deserve.   If we cannot assure that you're getting that kind of care,  then we cannot manage your problem effectively from this clinic.  Once you have seen Tammy and we are sure that we're all on the same page with your medication use she will arrange follow up with me.

## 2014-06-25 NOTE — Progress Notes (Signed)
Subjective:    Patient ID: Wesley Beard, male    DOB: [birth year],    MRN: [id number]    Brief patient profile:  6 yowm  last smoked x 2008 with onset of cough around 2014 gradually worse with "neg w/u" by [facility] at the onset of the cough but gradually worse since then so referred by  Raelyn Number PA for Dr Emilee Hero - bonsu to pulmonary clinic 06/08/2014    . 06/08/2014 1st [facility] office visit/ Demi Trieu   Chief Complaint  Patient presents with  . Pulmonary Consult    Referred by Dr. Vista Lawman. Pt c/o cough and SOB for the past 2 yrs- worse since Jan 2016.  His cough is prod with clear sputum, wakes up sometimes with cough.  He states that he feels SOB all of the time-exertion makes SOB and coug worse.   indolent onset x 2 y progressively worse doe across a parking lot assoc with worse cough with deeper breath  No better with prednisone No assoc arthritis  Has palpitation hx rx with BB  but never amiodarone / chemo  Cough is mosly dry , occ maybe a tsp with a violent cough but this is clear rec Stop tessilon and your inhalers since they are not working  First take delsym two tsp every 12 hours and supplement if needed with  tramadol 50 mg up to 2 every 4 hours to suppress the urge to cough at all or even clear your throat. Swallowing water or using ice chips/non mint and menthol containing candies (such as lifesavers or sugarless jolly ranchers) are also effective.  You should rest your voice and avoid activities that you know make you cough. Once you have eliminated the cough for 3 straight days try reducing the tramadol first,  then the delsym as tolerated.   Prednisone 10 mg take  4 each am x 2 days,   2 each am x 2 days,  1 each am x 2 days and stop (this is to eliminate allergies and inflammation from coughing) Protonix (pantoprazole) Take 30-60 min before first  And last meal of the day and Pepcid 20 mg one bedtime until return  GERD (REFLUX)  is an extremely common  cause of respiratory symptoms, many times with no significant heartburn at all.    06/25/2014 f/u ov/Ranesha Val re: PF with severe cough on ppi bid and pepcid at hs / never took the prednisone  Chief Complaint  Patient presents with  . Follow-up    PFT done today. Cough is unchanged. He states nothing has helped.    No obvious day to day or daytime variabilty or assoc cp or chest tightness, subjective wheeze overt sinus or hb symptoms. No unusual exp hx or h/o childhood pna/ asthma or knowledge of premature birth.  Sleeping ok without nocturnal  or early am exacerbation  of respiratory  c/o's or need for noct saba. Also denies any obvious fluctuation of symptoms with weather or environmental changes or other aggravating or alleviating factors except as outlined above   Current Medications, Allergies, Complete Past Medical History, Past Surgical History, Family History, and Social History were reviewed in [employer] record.  ROS  The following are not active complaints unless bolded sore throat, dysphagia, dental problems, itching, sneezing,  nasal congestion or excess/ purulent secretions, ear ache,   fever, chills, sweats, unintended wt loss, pleuritic or exertional cp, hemoptysis,  orthopnea pnd or leg swelling, presyncope, palpitations, heartburn, abdominal pain, anorexia,  nausea, vomiting, diarrhea  or change in bowel or urinary habits, change in stools or urine, dysuria,hematuria,  rash, arthralgias, visual complaints, headache, numbness weakness or ataxia or problems with walking or coordination,  change in mood/affect or memory.              Objective:   Physical Exam   amb wm with harsh cough  06/25/2014       231  Wt Readings from Last 3 Encounters:  06/08/14 230 lb (104.327 kg)  03/05/14 247 lb 6.4 oz (112.22 kg)  02/17/14 220 lb (99.791 kg)    Vital signs reviewed    HEENT:top dentures/  nl turbinates, and orophanx. Nl external ear canals without cough  reflex   NECK :  without JVD/Nodes/TM/ nl carotid upstrokes bilaterally   LUNGS: no acc muscle use, classic BV bs bilaterally with coarse insp crackles half way up and cough on each deep breath    CV:  RRR  no s3 or murmur or increase in P2, no edema   ABD:  soft and nontender with nl excursion in the supine position. No bruits or organomegaly, bowel sounds nl  MS:  warm without deformities, calf tenderness, cyanosis - bilateral mild upper ext clubbing SKIN: warm and dry without lesions    NEURO:  alert, approp, no deficits   CXR PA and Lateral:   06/08/2014 :     I personally reviewed images and agree with radiology impression as follows:    1. Diffuse bilateral from interstitial prominence again noted consistent chronic interstitial lung disease. Mild active pneumonitis cannot be excluded. 2. Stable cardiomegaly.          Assessment & Plan:

## 2014-06-28 ENCOUNTER — Other Ambulatory Visit: Payer: Self-pay | Admitting: Internal Medicine

## 2014-06-28 ENCOUNTER — Telehealth: Payer: Self-pay | Admitting: Internal Medicine

## 2014-06-28 NOTE — Telephone Encounter (Signed)
Per 06/25/14 OV; Patient Instructions     rednisone 10 mg Take 4 for three days 3 for three days 2 for three days 1 for three days and stop  Protonix (pantoprazole) Take 30-60 min before first and last meal of the day and Pepcid 20 mg one bedtime until return  Take delsym two tsp every 12 hours and supplement if needed with Tylenol #3 up to 2 every 4 hours to suppress the urge to cough. Swallowing water or using ice chips/non mint and menthol containing candies (such as lifesavers or sugarless jolly ranchers) are also effective. You should rest your voice and avoid activities that you know make you cough. Once you have eliminated the cough for 3 straight days try reducing the tylenol #3 , then the delsym as tolerated  --  Called spoke with pt. Made him aware MW gave him RX for tylenol #3 not tramadol. He did get this RX. Nothing further needed

## 2014-06-30 ENCOUNTER — Encounter: Payer: Self-pay | Admitting: Internal Medicine

## 2014-06-30 NOTE — Assessment & Plan Note (Signed)
Most likely has a component of  Classic Upper airway cough syndrome, so named because it's frequently impossible to sort out how much is  CR/sinusitis with freq throat clearing (which can be related to primary GERD)   vs  causing  secondary (" extra esophageal")  GERD from wide swings in gastric pressure that occur with throat clearing, often  promoting self use of mint and menthol lozenges that reduce the lower esophageal sphincter tone and exacerbate the problem further in a cyclical fashion.   These are the same pts (now being labeled as having "irritable larynx syndrome" by some cough centers) who not infrequently have a history of having failed to tolerate ace inhibitors,  dry powder inhalers or biphosphonates or report having atypical reflux symptoms that don't respond to standard doses of PPI , and are easily confused as having aecopd or asthma flares by even experienced allergists/ pulmonologists.    For now rec maintaining on max gerd rx plus add back neurontin (had been on it for separate problem but not taking consistently

## 2014-06-30 NOTE — Assessment & Plan Note (Addendum)
-  symptoms started 2014  - CT with contrast 05/22/14 > Increasing peripheral reticulation and ground-glass opacities throughout both lungs, right greater than left. Findings likely reflect worsening chronic interstitial lung disease. - 06/09/2014  Walked RA  2 laps @ 185 ft each stopped due to  Cough/ sob/ no desat/ slow pace  - collage vasc profile 06/08/2014 >  ESR 27 / neg  - trial of max gerd rx 06/08/2014 > did not take it  - PFTs 06/25/14  VC  2.83 (no obst) dlco 55 corrects to 104   I had an extended discussion with the patient reviewing all relevant studies completed to date and  lasting 15 to 20 minutes of a 25 minute visit on the following ongoing concerns:   1) PF is longtstanding and probably the reason he coughs but they appears to have a secondary (upper airway) component that needs to be separately addressed (see cough)  2) Use of PPI is associated with improved survival time and with decreased radiologic fibrosis per King's study published in AJRCCM vol 184 p1390.  Dec 2011  This may not be cause and effect, but given how universally unimpressive and expensive  all the other  Drugs developed to day  have been for pf,   rec start  rx ppi / diet/ lifestyle modification and f/u with serial walking sats and lung volumes for now to put more points on the curve / establish firm baseline before considering additional measures.   3) regardless with what we do going forward, pt will need much more consistent adherence to a complex medical regimen so we can assess his response to same on each ov.   To keep things simple, I have asked the patient to first separate medicines that are perceived as maintenance, that is to be taken daily "no matter what", from those medicines that are taken on only on an as-needed basis and I have given the patient examples of both, and then return to see our NP to generate a  detailed  medication calendar which should be followed until the next physician sees the  patient and updates it.     4) Each maintenance medication was reviewed in detail including most importantly the difference between maintenance and as needed and under what circumstances the prns are to be used.  Please see instructions for details which were reviewed in writing and the patient given a copy.

## 2014-07-10 ENCOUNTER — Ambulatory Visit (INDEPENDENT_AMBULATORY_CARE_PROVIDER_SITE_OTHER): Payer: Medicare Other | Admitting: Adult Health

## 2014-07-10 ENCOUNTER — Encounter (INDEPENDENT_AMBULATORY_CARE_PROVIDER_SITE_OTHER): Payer: Self-pay

## 2014-07-10 ENCOUNTER — Encounter: Payer: Self-pay | Admitting: Adult Health

## 2014-07-10 VITALS — BP 126/80 | HR 62 | Temp 98.1°F | Ht 68.0 in | Wt 230.4 lb

## 2014-07-10 DIAGNOSIS — R05 Cough: Secondary | ICD-10-CM

## 2014-07-10 DIAGNOSIS — J8410 Pulmonary fibrosis, unspecified: Secondary | ICD-10-CM | POA: Diagnosis not present

## 2014-07-10 DIAGNOSIS — R059 Cough, unspecified: Secondary | ICD-10-CM

## 2014-07-10 NOTE — Patient Instructions (Signed)
May take Pantoprazole 40mg  daily in am before meal .  May take Omeprazole 20mg  daily in pm before meal .  If cough restarts can use Delsym 2 tsp Twice daily  As needed   Once moved to [place] , establish with Pulmonary MD .  Take CD of CT chest with you.  follow up As needed   Good luck on your move.

## 2014-07-10 NOTE — Progress Notes (Signed)
Subjective:    Patient ID: Wesley Beard, male    DOB: Jun 25, 1950,    MRN: [id number]    Brief patient profile:  31 yowm  last smoked x 2008 with onset of cough around 2014 gradually worse with "neg w/u" by [facility] at the onset of the cough but gradually worse since then so referred by  Raelyn Number PA for Dr Emilee Hero - bonsu to pulmonary clinic 06/08/2014    . 06/08/2014 1st [facility] office visit/ [facility]   Chief Complaint  Patient presents with  . Pulmonary Consult    Referred by Dr. Vista Lawman. Pt c/o cough and SOB for the past 2 yrs- worse since Jan 2016.  His cough is prod with clear sputum, wakes up sometimes with cough.  He states that he feels SOB all of the time-exertion makes SOB and coug worse.   indolent onset x 2 y progressively worse doe across a parking lot assoc with worse cough with deeper breath  No better with prednisone No assoc arthritis  Has palpitation hx rx with BB  but never amiodarone / chemo  Cough is mosly dry , occ maybe a tsp with a violent cough but this is clear rec Stop tessilon and your inhalers since they are not working  First take delsym two tsp every 12 hours and supplement if needed with  tramadol 50 mg up to 2 every 4 hours to suppress the urge to cough at all or even clear your throat. Swallowing water or using ice chips/non mint and menthol containing candies (such as lifesavers or sugarless jolly ranchers) are also effective.  You should rest your voice and avoid activities that you know make you cough. Once you have eliminated the cough for 3 straight days try reducing the tramadol first,  then the delsym as tolerated.   Prednisone 10 mg take  4 each am x 2 days,   2 each am x 2 days,  1 each am x 2 days and stop (this is to eliminate allergies and inflammation from coughing) Protonix (pantoprazole) Take 30-60 min before first  And last meal of the day and Pepcid 20 mg one bedtime until return  GERD (REFLUX)  is an extremely common  cause of respiratory symptoms, many times with no significant heartburn at all.    06/25/2014 f/u ov/[facility] re: PF with severe cough on ppi bid and pepcid at hs / never took the prednisone  Chief Complaint  Patient presents with  . Follow-up    PFT done today. Cough is unchanged. He states nothing has helped.   >>pred taper and cough regimen with GERD and restart on neurontin   07/10/2014 Follow up : IPF  Patient returns for a two-week follow-up Has been followed for pulmonary for ongoing cough and pulmonary fibrosis  CT with contrast 05/22/14 > Increasing peripheral reticulation and ground-glass opacities throughout both lungs, right greater than left. Findings likely reflect worsening chronic interstitial lung disease. - 06/09/2014 Walked RA 2 laps @ 185 ft each stopped due to Cough/ sob/ no desat/ slow pace  - [facility] vasc profile 06/08/2014 > ESR 27 / neg  - trial of max gerd rx 06/08/2014 > did not take it  - PFTs 06/25/14 VC 2.83 (no obst) dlco 55 corrects to 104  He was given pred taper , placed on cough regimen with delsym and tylenol #3, GERD tx  and restarted on neurontin.  He says he feels so much better . Cough is near resolved.  No longer using tylenol # 3 or delsym.  He is moving to [place] in few weeks to be closer to family .  We discussed will need to be followed by pulmonary and to take CT chest CD .  Advised to request records when established with pulmonary in [place].  Patient denies any chest pain, orthopnea, PND or leg swelling.    Current Medications, Allergies, Complete Past Medical History, Past Surgical History, Family History, and Social History were reviewed in [employer] record.  ROS  The following are not active complaints unless bolded sore throat, dysphagia, dental problems, itching, sneezing,  nasal congestion or excess/ purulent secretions, ear ache,   fever, chills, sweats, unintended wt loss, pleuritic or exertional cp,  hemoptysis,  orthopnea pnd or leg swelling, presyncope, palpitations, heartburn, abdominal pain, anorexia, nausea, vomiting, diarrhea  or change in bowel or urinary habits, change in stools or urine, dysuria,hematuria,  rash, arthralgias, visual complaints, headache, numbness weakness or ataxia or problems with walking or coordination,  change in mood/affect or memory.              Objective:   Physical Exam   amb wm with harsh cough  06/25/2014       231 >>230 >231 07/10/2014     Vital signs reviewed    HEENT:top dentures/  nl turbinates, and orophanx. Nl external ear canals without cough reflex   NECK :  without JVD/Nodes/TM/ nl carotid upstrokes bilaterally   LUNGS: no acc muscle use, classic BV bs bilaterally with coarse insp crackles half way up and cough on each deep breath    CV:  RRR  no s3 or murmur or increase in P2, no edema   ABD:  soft and nontender with nl excursion in the supine position. No bruits or organomegaly, bowel sounds nl  MS:  warm without deformities, calf tenderness, cyanosis - bilateral mild upper ext clubbing SKIN: warm and dry without lesions    NEURO:  alert, approp, no deficits   CXR PA and Lateral:   06/08/2014 :     I personally reviewed images and agree with radiology impression as follows:    1. Diffuse bilateral from interstitial prominence again noted consistent chronic interstitial lung disease. Mild active pneumonitis cannot be excluded. 2. Stable cardiomegaly.          Assessment & Plan:

## 2014-07-10 NOTE — Assessment & Plan Note (Signed)
Cough improved on present regimen w/ tx aimed at GERD prevention .   Plan  May take Pantoprazole 40mg  daily in am before meal .  May take Omeprazole 20mg  daily in pm before meal .  If cough restarts can use Delsym 2 tsp Twice daily  As needed   Once moved to [place] , establish with Pulmonary MD .  Take CD of CT chest with you.  follow up As needed   Good luck on your move.

## 2014-07-10 NOTE — Progress Notes (Signed)
Chart and office note reviewed in detail along with available xrays/ labs > note better with gerd rx desite sign PF with plans to f/u in [place] with pulm >>  agree with a/p as outlined

## 2014-07-10 NOTE — Assessment & Plan Note (Signed)
May take Pantoprazole 40mg  daily in am before meal .  May take Omeprazole 20mg  daily in pm before meal .  If cough restarts can use Delsym 2 tsp Twice daily  As needed   Once moved to [place] , establish with Pulmonary MD .  Take CD of CT chest with you.  follow up As needed   Good luck on your move.

## 2016-03-13 IMAGING — CR DG CHEST 1V PORT
1 series · 1 of 1 positions shown · non-contrast
Comparison: None.

CLINICAL DATA: Trauma. Right arm fracture. Chronic cough. Smoker.

EXAM:
PORTABLE CHEST - 1 VIEW

[AP]
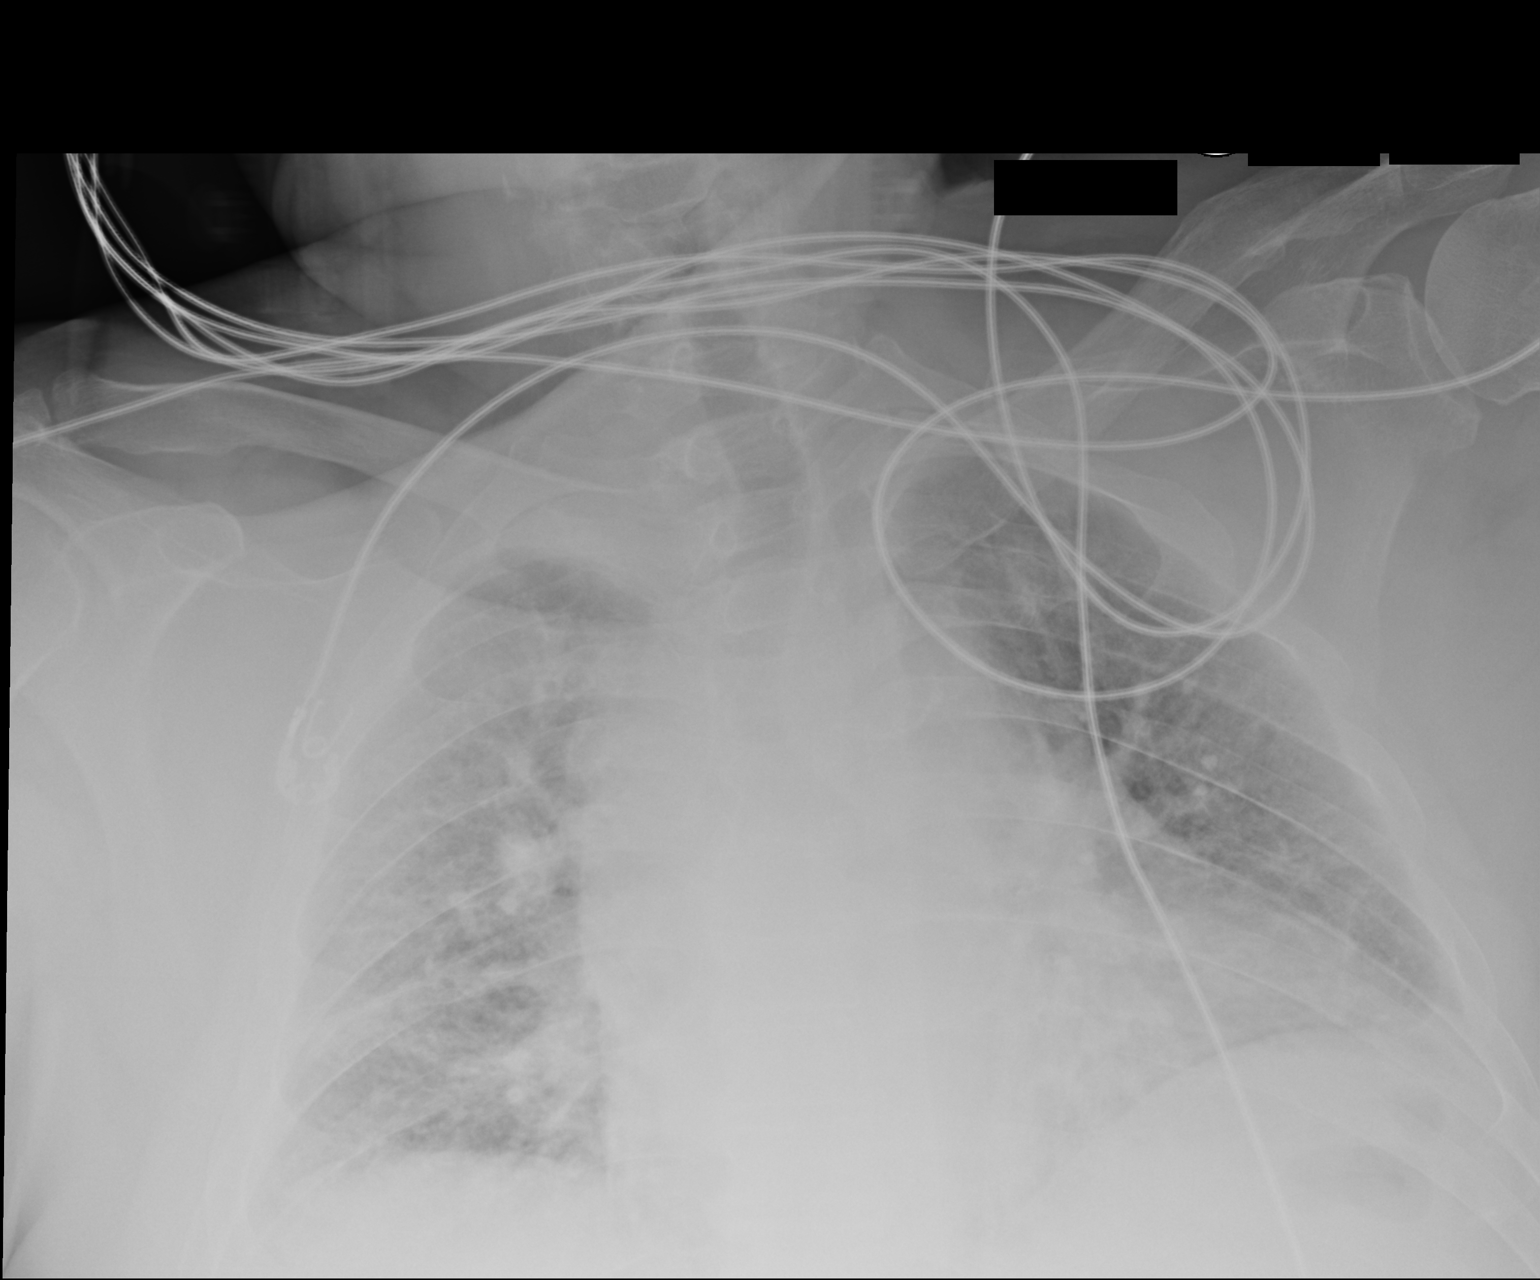

[1 of 1 positions shown; findings below may reference images not displayed]

FINDINGS: Shallow inspiration. Cardiac enlargement with prominent pulmonary
vascularity. This suggests Mild congestion. No edema or
consolidation suggested. No blunting of costophrenic angles. No
pneumothorax. Old fracture deformity of the left clavicle.
IMPRESSION: Cardiac enlargement with increased pulmonary vascularity suggesting
mild vascular congestion.

## 2016-03-13 IMAGING — CT CT HEAD W/O CM
3 of 6 series · 14 of 47 positions shown, 16 images · non-contrast
Comparison: None.

CLINICAL DATA: Pt fell [place] approx 15 cement stairs [place].Rt arm pain was very severe and pt was unable to stop
shaking on scanner

EXAM:
CT HEAD WITHOUT CONTRAST
CT CERVICAL SPINE WITHOUT CONTRAST
TECHNIQUE: Multidetector CT imaging of the head and cervical spine was
performed following the standard protocol without intravenous
contrast. Multiplanar CT image reconstructions of the cervical spine
were also generated.

[Series 302: soft tissue, idose (2) · axial · 0.39mm/px · z∈[+26,+200]mm · 8 of 109 slices shown, 10 images]
[im 11/109  brain]
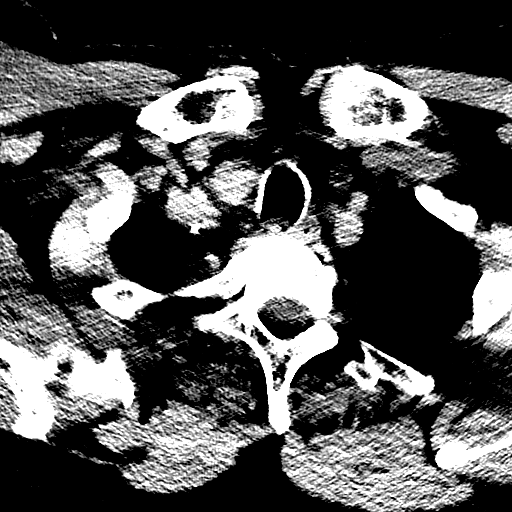
[im 11/109  bone]
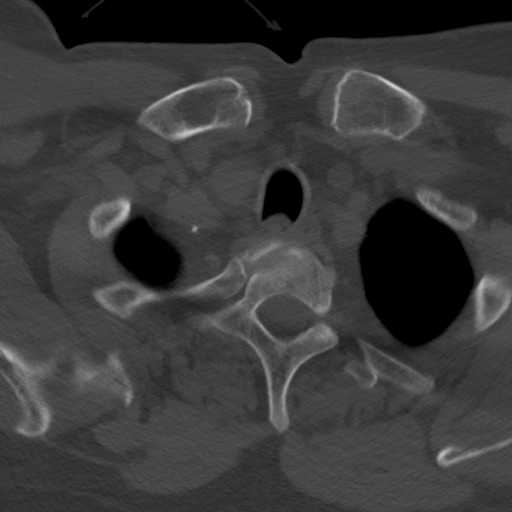
[im 22/109  brain]
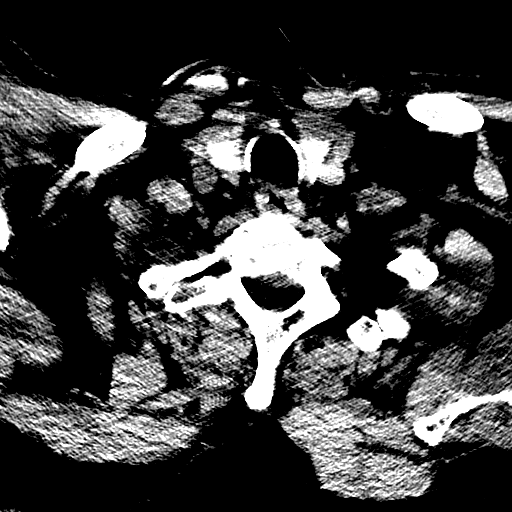
[im 33/109  brain]
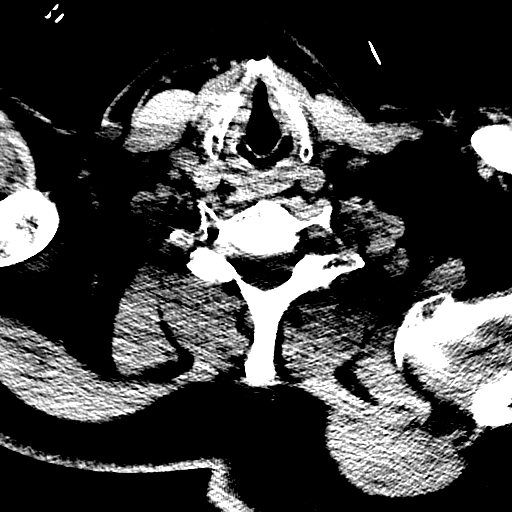
[im 44/109  brain]
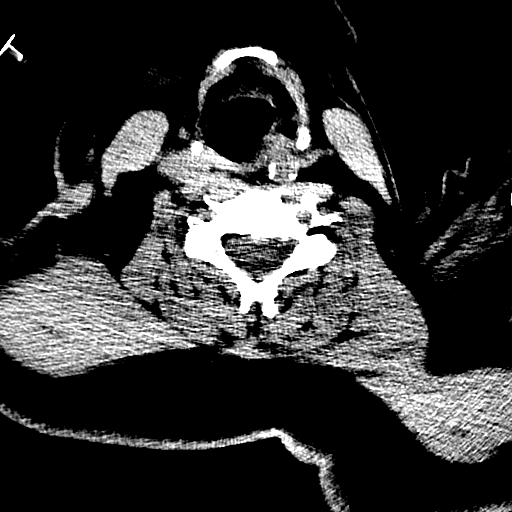
[im 65/109  brain]
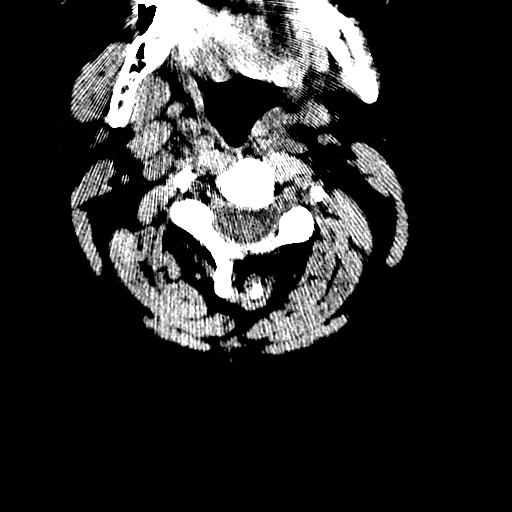
[im 65/109  bone]
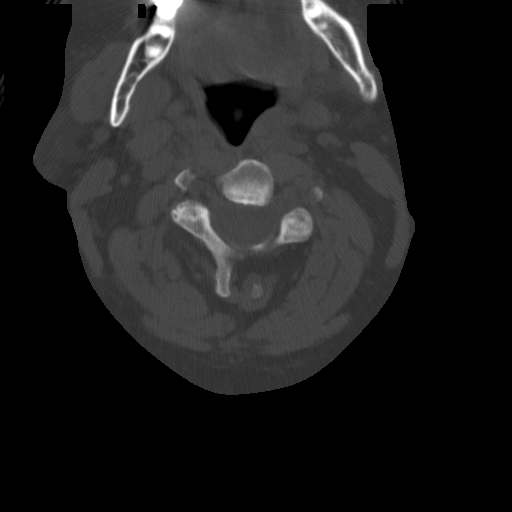
[im 76/109  brain]
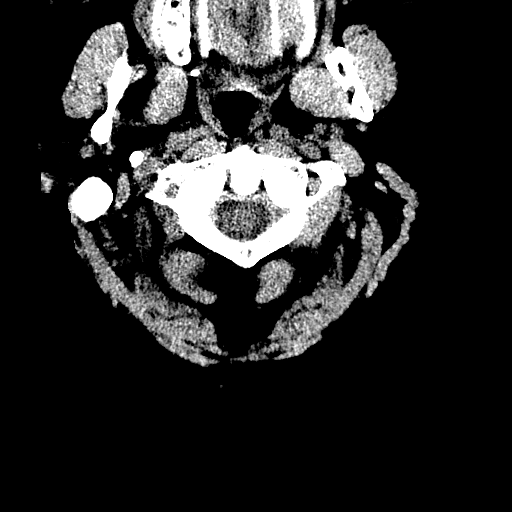
[im 87/109  brain]
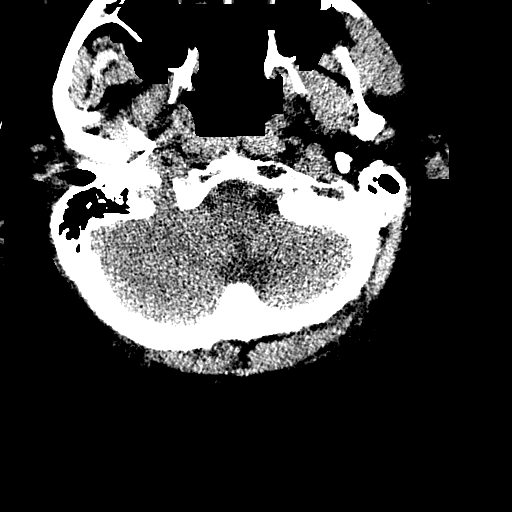
[im 98/109  brain]
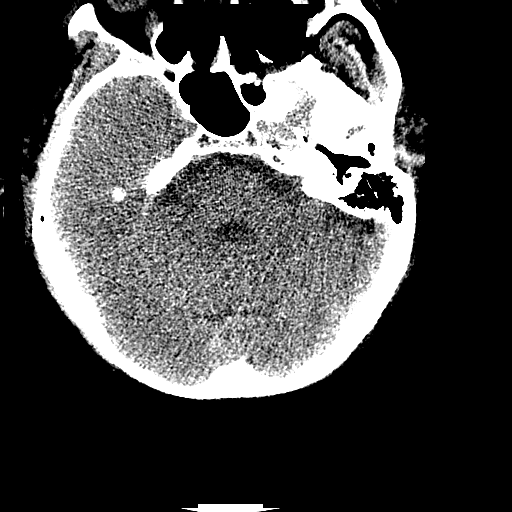

[Series 303: sagittal, idose (2) · sagittal · 0.34mm/px · 3 of 60 slices shown]
[im 20/60  brain]
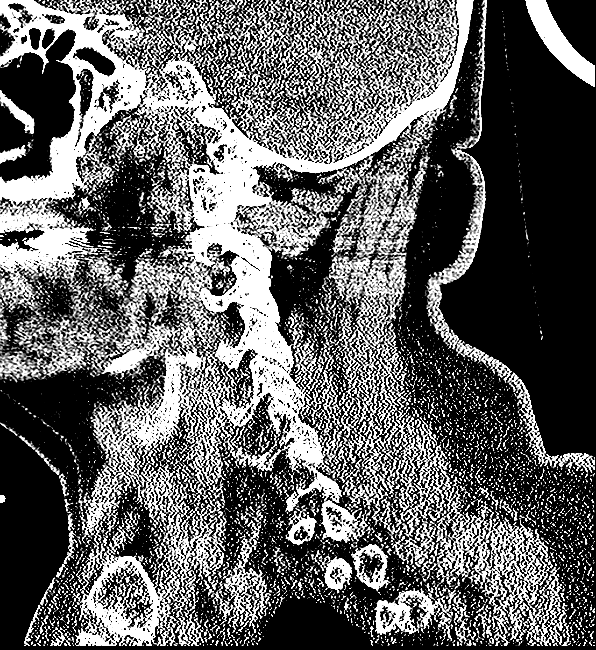
[im 30/60  brain]
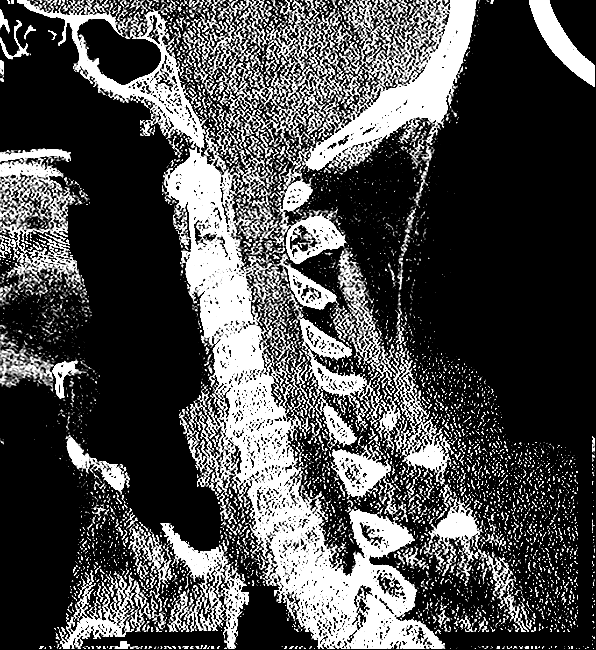
[im 40/60  brain]
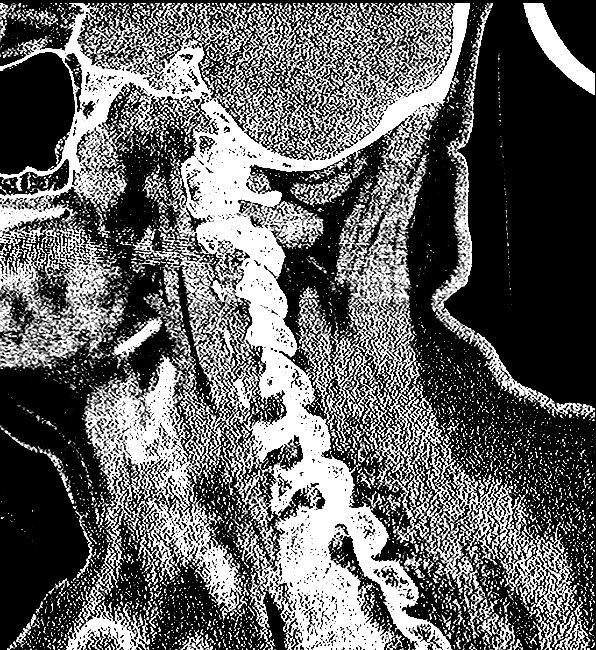

[Series 308: coronal, idose (2) · coronal · 0.34mm/px · 3 of 43 slices shown]
[im 15/43  brain]
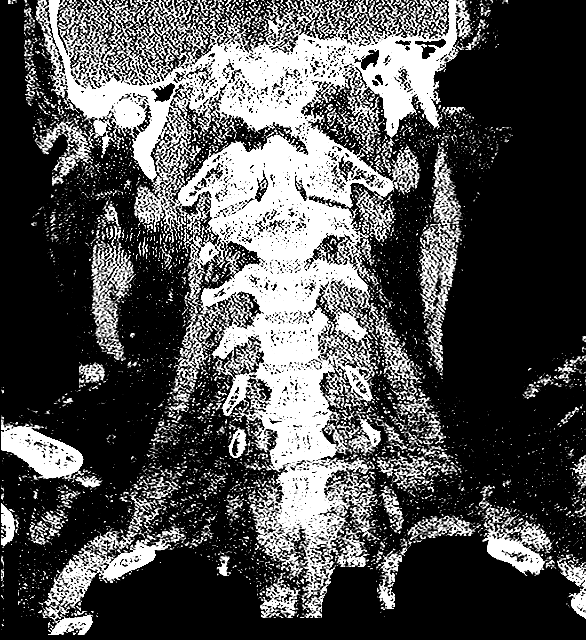
[im 19/43  brain]
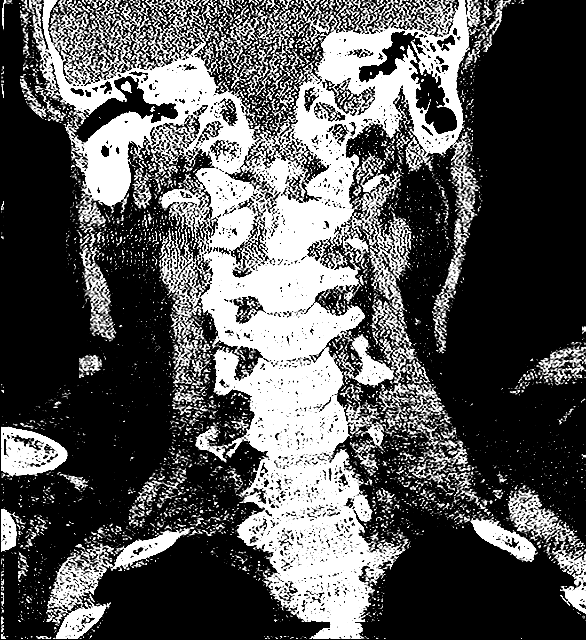
[im 24/43  brain]
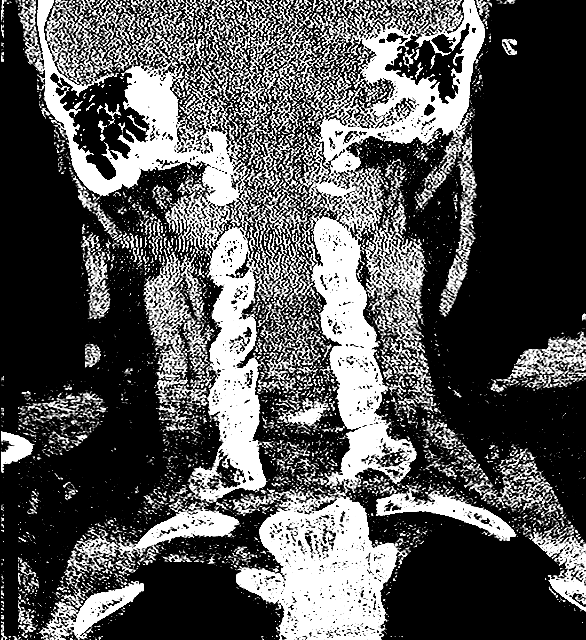

[14 of 47 positions shown; findings below may reference images not displayed]

FINDINGS: CT HEAD FINDINGS

No intracranial hemorrhage. No parenchymal contusion. No midline
shift or mass effect. Basilar cisterns are patent. No skull base
fracture. No fluid in the paranasal sinuses or mastoid air cells.
Orbits are normal.

Generalized cortical atrophy. Mild periventricular white matter
hypodensities.

CT CERVICAL SPINE FINDINGS

No prevertebral soft tissue swelling. Normal alignment of cervical
vertebral bodies. No loss of vertebral body height. Normal facet
articulation. Normal craniocervical junction.

No evidence epidural or paraspinal hematoma.
IMPRESSION: 1. No acute intracranial trauma.
2. Atrophy and mild white matter microvascular disease.
3. No cervical spine fracture.

## 2021-05-10 DEATH — deceased
